# Patient Record
Sex: Female | Born: 1955 | Race: White | Hispanic: No
Health system: Western US, Academic
[De-identification: ages and names within clinical notes are randomized; demographics above are authoritative.]

## PROBLEM LIST (undated history)

## (undated) ENCOUNTER — Emergency Department (HOSPITAL_BASED_OUTPATIENT_CLINIC_OR_DEPARTMENT_OTHER): Admission: EM | Payer: Self-pay | Source: Home / Self Care

## (undated) VITALS — BP 100/60 | HR 84 | Temp 97.6°F | Resp 18

## (undated) VITALS — BP 110/60 | HR 65 | Temp 98.7°F | Resp 18 | Wt 176.0 lb

## (undated) VITALS — BP 126/62 | HR 72 | Temp 96.4°F | Resp 18 | Wt 175.4 lb

## (undated) DIAGNOSIS — J342 Deviated nasal septum: Secondary | ICD-10-CM

## (undated) DIAGNOSIS — H90A22 Sensorineural hearing loss, unilateral, left ear, with restricted hearing on the contralateral side: Secondary | ICD-10-CM

## (undated) DIAGNOSIS — E785 Hyperlipidemia, unspecified: Secondary | ICD-10-CM

## (undated) DIAGNOSIS — H90A31 Mixed conductive and sensorineural hearing loss, unilateral, right ear with restricted hearing on the contralateral side: Secondary | ICD-10-CM

## (undated) DIAGNOSIS — H2513 Age-related nuclear cataract, bilateral: Secondary | ICD-10-CM

## (undated) DIAGNOSIS — Z5111 Encounter for antineoplastic chemotherapy: Secondary | ICD-10-CM

## (undated) DIAGNOSIS — T17908A Unspecified foreign body in respiratory tract, part unspecified causing other injury, initial encounter: Secondary | ICD-10-CM

## (undated) DIAGNOSIS — J439 Emphysema, unspecified: Secondary | ICD-10-CM

## (undated) DIAGNOSIS — H6993 Unspecified Eustachian tube disorder, bilateral: Secondary | ICD-10-CM

## (undated) DIAGNOSIS — C719 Malignant neoplasm of brain, unspecified: Secondary | ICD-10-CM

## (undated) DIAGNOSIS — H3581 Retinal edema: Secondary | ICD-10-CM

## (undated) DIAGNOSIS — H7291 Unspecified perforation of tympanic membrane, right ear: Secondary | ICD-10-CM

## (undated) DIAGNOSIS — Z515 Encounter for palliative care: Secondary | ICD-10-CM

## (undated) DIAGNOSIS — C8599 Non-Hodgkin lymphoma, unspecified, extranodal and solid organ sites: Secondary | ICD-10-CM

## (undated) DIAGNOSIS — G44009 Cluster headache syndrome, unspecified, not intractable: Secondary | ICD-10-CM

## (undated) DIAGNOSIS — C8590 Non-Hodgkin lymphoma, unspecified, unspecified site: Secondary | ICD-10-CM

## (undated) DIAGNOSIS — R1311 Dysphagia, oral phase: Secondary | ICD-10-CM

## (undated) DIAGNOSIS — C8589 Other specified types of non-Hodgkin lymphoma, extranodal and solid organ sites: Secondary | ICD-10-CM

## (undated) DIAGNOSIS — J343 Hypertrophy of nasal turbinates: Secondary | ICD-10-CM

## (undated) DIAGNOSIS — C8339 Primary central nervous system lymphoma: Secondary | ICD-10-CM

## (undated) DIAGNOSIS — H903 Sensorineural hearing loss, bilateral: Secondary | ICD-10-CM

## (undated) DIAGNOSIS — I2510 Atherosclerotic heart disease of native coronary artery without angina pectoris: Secondary | ICD-10-CM

## (undated) DIAGNOSIS — F0390 Unspecified dementia without behavioral disturbance: Secondary | ICD-10-CM

## (undated) DIAGNOSIS — H6593 Unspecified nonsuppurative otitis media, bilateral: Secondary | ICD-10-CM

## (undated) DIAGNOSIS — M797 Fibromyalgia: Secondary | ICD-10-CM

## (undated) DIAGNOSIS — F4321 Adjustment disorder with depressed mood: Secondary | ICD-10-CM

## (undated) HISTORY — DX: Primary central nervous system lymphoma: C83.390

## (undated) HISTORY — DX: Sensorineural hearing loss, bilateral: H90.3

## (undated) HISTORY — DX: Adjustment disorder with depressed mood: F43.21

## (undated) HISTORY — DX: Cluster headache syndrome, unspecified, not intractable: G44.009

## (undated) HISTORY — DX: Fibromyalgia: M79.7

## (undated) HISTORY — DX: Sensorineural hearing loss, unilateral, left ear, with restricted hearing on the contralateral side: H90.A22

## (undated) HISTORY — DX: Encounter for palliative care: Z51.5

## (undated) HISTORY — PX: CATARACT EXTRACTION: SUR2

## (undated) HISTORY — DX: Hypertrophy of nasal turbinates: J34.3

## (undated) HISTORY — DX: Dysphagia, oral phase: R13.11

## (undated) HISTORY — DX: Unspecified perforation of tympanic membrane, right ear: H72.91

## (undated) HISTORY — DX: Retinal edema: H35.81

## (undated) HISTORY — DX: Other specified types of non-hodgkin lymphoma, extranodal and solid organ sites: C85.89

## (undated) HISTORY — DX: Non-Hodgkin lymphoma, unspecified, extranodal and solid organ sites: C85.99

## (undated) HISTORY — DX: Deviated nasal septum: J34.2

## (undated) HISTORY — DX: Unspecified dementia, unspecified severity, without behavioral disturbance, psychotic disturbance, mood disturbance, and anxiety: F03.90

## (undated) HISTORY — PX: BRAIN BIOPSY: SHX905

## (undated) HISTORY — DX: Mixed conductive and sensorineural hearing loss, unilateral, right ear with restricted hearing on the contralateral side: H90.A31

## (undated) HISTORY — PX: CHOLECYSTECTOMY: SHX55

## (undated) HISTORY — DX: Atherosclerotic heart disease of native coronary artery without angina pectoris: I25.10

## (undated) HISTORY — DX: Unspecified foreign body in respiratory tract, part unspecified causing other injury, initial encounter: T17.908A

## (undated) HISTORY — DX: Hyperlipidemia, unspecified: E78.5

## (undated) HISTORY — DX: Malignant neoplasm of brain, unspecified: C71.9

## (undated) HISTORY — DX: Encounter for antineoplastic chemotherapy: Z51.11

## (undated) HISTORY — DX: Unspecified eustachian tube disorder, bilateral: H69.93

## (undated) HISTORY — PX: TUBAL LIGATION: SHX77

## (undated) HISTORY — PX: TONSILLECTOMY: SUR1361

## (undated) HISTORY — DX: Age-related nuclear cataract, bilateral: H25.13

## (undated) HISTORY — DX: Emphysema, unspecified: J43.9

## (undated) HISTORY — DX: Unspecified nonsuppurative otitis media, bilateral: H65.93

## (undated) DEATH — deceased

---

## 1998-01-18 ENCOUNTER — Ambulatory Visit (HOSPITAL_BASED_OUTPATIENT_CLINIC_OR_DEPARTMENT_OTHER): Admission: RE | Admit: 1998-01-18 | Discharge: 1998-01-18 | Payer: Self-pay | Admitting: Orthopedic Surgery

## 1998-05-04 ENCOUNTER — Encounter: Admission: RE | Admit: 1998-05-04 | Discharge: 1998-06-03 | Payer: Self-pay | Admitting: Neurosurgery

## 2010-01-23 ENCOUNTER — Other Ambulatory Visit: Payer: Self-pay

## 2010-01-30 ENCOUNTER — Other Ambulatory Visit: Payer: Self-pay

## 2010-03-17 ENCOUNTER — Encounter (HOSPITAL_BASED_OUTPATIENT_CLINIC_OR_DEPARTMENT_OTHER): Payer: Self-pay | Admitting: Medical Oncology

## 2010-03-17 ENCOUNTER — Encounter (HOSPITAL_BASED_OUTPATIENT_CLINIC_OR_DEPARTMENT_OTHER): Payer: Medicare Other | Admitting: Hematology & Oncology

## 2010-04-17 ENCOUNTER — Other Ambulatory Visit: Payer: Self-pay

## 2010-06-03 ENCOUNTER — Other Ambulatory Visit: Payer: Self-pay

## 2010-06-04 ENCOUNTER — Other Ambulatory Visit: Payer: Self-pay

## 2010-06-05 ENCOUNTER — Other Ambulatory Visit: Payer: Self-pay

## 2010-06-06 ENCOUNTER — Other Ambulatory Visit: Payer: Self-pay

## 2010-06-07 ENCOUNTER — Other Ambulatory Visit: Payer: Self-pay

## 2010-06-08 ENCOUNTER — Other Ambulatory Visit: Payer: Self-pay

## 2010-06-09 ENCOUNTER — Other Ambulatory Visit: Payer: Self-pay

## 2010-06-10 ENCOUNTER — Other Ambulatory Visit: Payer: Self-pay

## 2010-06-11 ENCOUNTER — Other Ambulatory Visit: Payer: Self-pay

## 2010-06-13 ENCOUNTER — Other Ambulatory Visit: Payer: Self-pay

## 2010-06-15 ENCOUNTER — Other Ambulatory Visit: Payer: Self-pay

## 2010-06-16 ENCOUNTER — Other Ambulatory Visit: Payer: Self-pay

## 2010-06-17 ENCOUNTER — Other Ambulatory Visit: Payer: Self-pay

## 2010-06-18 ENCOUNTER — Other Ambulatory Visit: Payer: Self-pay

## 2010-06-19 ENCOUNTER — Other Ambulatory Visit: Payer: Self-pay

## 2010-06-20 ENCOUNTER — Other Ambulatory Visit: Payer: Self-pay

## 2010-06-21 ENCOUNTER — Other Ambulatory Visit: Payer: Self-pay

## 2010-06-22 ENCOUNTER — Other Ambulatory Visit: Payer: Self-pay

## 2010-06-23 ENCOUNTER — Other Ambulatory Visit: Payer: Self-pay

## 2010-06-24 ENCOUNTER — Other Ambulatory Visit: Payer: Self-pay

## 2010-06-25 ENCOUNTER — Other Ambulatory Visit: Payer: Self-pay

## 2012-11-05 ENCOUNTER — Other Ambulatory Visit: Payer: Self-pay

## 2013-12-08 ENCOUNTER — Emergency Department (HOSPITAL_BASED_OUTPATIENT_CLINIC_OR_DEPARTMENT_OTHER)
Admission: EM | Admit: 2013-12-08 | Discharge: 2013-12-08 | Disposition: A | Discharge status: Home / Self Care | Payer: Medicare Other | Attending: Emergency Medicine | Admitting: Emergency Medicine

## 2013-12-08 ENCOUNTER — Encounter (HOSPITAL_BASED_OUTPATIENT_CLINIC_OR_DEPARTMENT_OTHER): Payer: Self-pay | Admitting: Emergency Medicine

## 2013-12-08 ENCOUNTER — Emergency Department (HOSPITAL_BASED_OUTPATIENT_CLINIC_OR_DEPARTMENT_OTHER): Payer: Medicare Other

## 2013-12-08 DIAGNOSIS — S46909A Unspecified injury of unspecified muscle, fascia and tendon at shoulder and upper arm level, unspecified arm, initial encounter: Secondary | ICD-10-CM | POA: Insufficient documentation

## 2013-12-08 DIAGNOSIS — X500XXA Overexertion from strenuous movement or load, initial encounter: Secondary | ICD-10-CM | POA: Insufficient documentation

## 2013-12-08 DIAGNOSIS — S4980XA Other specified injuries of shoulder and upper arm, unspecified arm, initial encounter: Secondary | ICD-10-CM | POA: Insufficient documentation

## 2013-12-08 DIAGNOSIS — Z85841 Personal history of malignant neoplasm of brain: Secondary | ICD-10-CM | POA: Insufficient documentation

## 2013-12-08 DIAGNOSIS — R296 Repeated falls: Secondary | ICD-10-CM | POA: Insufficient documentation

## 2013-12-08 DIAGNOSIS — Z79899 Other long term (current) drug therapy: Secondary | ICD-10-CM | POA: Insufficient documentation

## 2013-12-08 DIAGNOSIS — IMO0002 Reserved for concepts with insufficient information to code with codable children: Secondary | ICD-10-CM | POA: Insufficient documentation

## 2013-12-08 DIAGNOSIS — F17200 Nicotine dependence, unspecified, uncomplicated: Secondary | ICD-10-CM | POA: Insufficient documentation

## 2013-12-08 DIAGNOSIS — S93401A Sprain of unspecified ligament of right ankle, initial encounter: Secondary | ICD-10-CM

## 2013-12-08 DIAGNOSIS — Z9221 Personal history of antineoplastic chemotherapy: Secondary | ICD-10-CM | POA: Insufficient documentation

## 2013-12-08 DIAGNOSIS — Y9389 Activity, other specified: Secondary | ICD-10-CM | POA: Insufficient documentation

## 2013-12-08 DIAGNOSIS — Y929 Unspecified place or not applicable: Secondary | ICD-10-CM | POA: Insufficient documentation

## 2013-12-08 DIAGNOSIS — S93409A Sprain of unspecified ligament of unspecified ankle, initial encounter: Secondary | ICD-10-CM | POA: Insufficient documentation

## 2013-12-08 HISTORY — DX: Non-Hodgkin lymphoma, unspecified, unspecified site: C85.90

## 2013-12-08 HISTORY — DX: Malignant neoplasm of brain, unspecified: C71.9

## 2013-12-08 MED ORDER — HYDROCODONE-ACETAMINOPHEN 5-325 MG PO TABS: 1.0000 | ORAL_TABLET | ORAL | Status: DC | PRN

## 2013-12-08 NOTE — ED Notes (Signed)
Pt refused crutches.

## 2013-12-08 NOTE — ED Provider Notes (Signed)
CSN: [id number]     Arrival date & time 12/08/13  1359 History  This chart was scribed for Orlie Dakin, MD by Vernell Barrier, ED scribe. This patient was seen in room [facility] and the patient's care was started at 3:08 PM.   Chief Complaint  Patient presents with  . Ankle Pain    The history is provided by the patient. No language interpreter was used.   HPI Comments: Krystal Stevens is a 58 y.o. female who presents to the Emergency Department complaining of right ankle pain and right ankle swelling; onset 2 days ago. Reports difficulty ambulating and pain with applying pressure. States she stepped off a curb incorrectly and her ankle twisted and she fell to the ground. Also reports some right elbow and right shoulder pain. Does not report any other injuries. Taken Vicodin for pain with minimal relief; last dosage 3 hours ago. States she took 1 pill at that time. Hx of brain cancer; still receiving chemotherapy. Reports intermittent memory lapse and trouble with balance and coordination as a result. Does not walk with a cane or walker. Hx of tubal ligation, tonsillectomy, and cholecystectomy. Regularly takes anti-depressant medication. Currently living in a nursing home.   Past Medical History  Diagnosis Date  . Brain cancer   . Non-Hodgkin's lymphoma    Past Surgical History  Procedure Laterality Date  . Cholecystectomy    . Tubal ligation    . Tonsillectomy    . Brain biopsy     No family history on file. History  Substance Use Topics  . Smoking status: Current Every Day Smoker -- 1.00 packs/day    Types: Cigarettes  . Smokeless tobacco: Not on file  . Alcohol Use: No   OB History   Grav Para Term Preterm Abortions TAB SAB Ect Mult Living                 Review of Systems  Constitutional: Negative.   HENT: Negative.   Respiratory: Negative.   Cardiovascular: Negative.   Gastrointestinal: Negative.   Musculoskeletal: Positive for arthralgias and joint swelling.   Skin: Negative.   Neurological: Negative.   Psychiatric/Behavioral: Negative.    Allergies  Review of patient's allergies indicates no known allergies.  Home Medications   Prior to Admission medications   Medication Sig Start Date End Date Taking? Authorizing Provider  citalopram (CELEXA) 10 MG tablet Take 10 mg by mouth daily.   Yes Historical Provider, MD  famotidine (PEPCID) 20 MG tablet Take 20 mg by mouth 2 (two) times daily.   Yes Historical Provider, MD  HYDROcodone-acetaminophen (NORCO) 7.5-325 MG per tablet Take 1 tablet by mouth every 6 (six) hours as needed for moderate pain.   Yes Historical Provider, MD  LORazepam (ATIVAN) 1 MG tablet Take 1 mg by mouth every 8 (eight) hours.   Yes Historical Provider, MD   Triage vitals: BP 109/69  Pulse 86  Temp(Src) 98.2 F (36.8 C) (Oral)  Resp 17  Ht 5\' 4"  (1.626 m)  Wt 170 lb (77.111 kg)  BMI 29.17 kg/m2  SpO2 98%  Physical Exam  Nursing note and vitals reviewed. Constitutional: She appears well-developed and well-nourished.  HENT:  Head: Normocephalic and atraumatic.  Eyes: Conjunctivae are normal. Pupils are equal, round, and reactive to light.  Neck: Neck supple. No tracheal deviation present. No thyromegaly present.  Cardiovascular: Normal rate and regular rhythm.   No murmur heard. Pulmonary/Chest: Effort normal and breath sounds normal.  Abdominal: Soft. Bowel sounds are normal.  She exhibits no distension. There is no tenderness.  Musculoskeletal: Normal range of motion. She exhibits no edema and no tenderness.  Right upper extremity: Small abrasion at the right elbow. No swelling. No point tenderness. Neurovascularly intact.   Right lower extremity: Tenderness at lateral malleolus. DP pulse 2+. Non tender over proximal fibula.   All other extremities are non atraumatic and neurovascularly in tact  Neurological: She is alert. Coordination normal.  Skin: Skin is warm and dry. No rash noted.  Psychiatric: She has  a normal mood and affect.   right upper extremity has full range of motion. The soft tissues  ED Course  Procedures (including critical care time) DIAGNOSTIC STUDIES: Oxygen Saturation is 98% on room air, normal by my interpretation.    COORDINATION OF CARE: At 3:15 PM: Discussed treatment plan with patient which includes pain medication and an ankle splint. Patient agrees.   Labs Review Labs Reviewed - No data to display  Imaging Review Dg Ankle Complete Right  12/08/2013   CLINICAL DATA:  Right ankle pain and swelling after injury.  EXAM: RIGHT ANKLE - COMPLETE 3+ VIEW  COMPARISON:  None.  FINDINGS: There is no evidence of fracture, dislocation, or joint effusion. There is no evidence of arthropathy or other focal bone abnormality. Soft tissue swelling over lateral malleolus is noted consistent with ligamentous injury.  IMPRESSION: No fracture or dislocation is noted. Soft tissue swelling over lateral malleolus consistent with ligamentous injury.   Electronically Signed   By: Sabino Dick M.D.   On: 12/08/2013 14:45     EKG Interpretation None     X-ray viewed by me. No results found for this or any previous visit. Dg Ankle Complete Right  12/08/2013   CLINICAL DATA:  Right ankle pain and swelling after injury.  EXAM: RIGHT ANKLE - COMPLETE 3+ VIEW  COMPARISON:  None.  FINDINGS: There is no evidence of fracture, dislocation, or joint effusion. There is no evidence of arthropathy or other focal bone abnormality. Soft tissue swelling over lateral malleolus is noted consistent with ligamentous injury.  IMPRESSION: No fracture or dislocation is noted. Soft tissue swelling over lateral malleolus consistent with ligamentous injury.   Electronically Signed   By: Sabino Dick M.D.   On: 12/08/2013 14:45   Patient is comfortable in air splint. She is unable to walk with crutches. Nursing home called. They will provide a wheelchair. MDM  X-rays of right upper extremity not indicated discuss with  patient and family who agree. Final diagnoses:  None   plan referral Dr.Hudnall, orthopedics. Prescription Norco Diagnosis #1 fall #2 right ankle sprain #3 contusions to right arm  I personally performed the services described in this documentation, which was scribed in my presence. The recorded information has been reviewed and is accurate.     Orlie Dakin, MD 12/08/13 [phone]

## 2013-12-08 NOTE — ED Notes (Signed)
Pt. Stepped off a curb without knowing there was a curb there causing injury to the R ankle.  Noted edema and pain 10/10.  Pt. Has positive pedal pulse.

## 2013-12-08 NOTE — Discharge Instructions (Signed)
Ankle Sprain °An ankle sprain is an injury to the strong, fibrous tissues (ligaments) that hold your ankle bones together.  °HOME CARE  °· Put ice on your ankle for 1-2 days or as told by your doctor. °¨ Put ice in a plastic bag. °¨ Place a towel between your skin and the bag. °¨ Leave the ice on for 15-20 minutes at a time, every 2 hours while you are awake. °· Only take medicine as told by your doctor. °· Raise (elevate) your injured ankle above the level of your heart as much as possible for 2-3 days. °· Use crutches if your doctor tells you to. Slowly put your own weight on the affected ankle. Use the crutches until you can walk without pain. °· If you have a plaster splint: °¨ Do not rest it on anything harder than a pillow for 24 hours. °¨ Do not put weight on it. °¨ Do not get it wet. °¨ Take it off to shower or bathe. °· If given, use an elastic wrap or support stocking for support. Take the wrap off if your toes lose feeling (numb), tingle, or turn cold or blue. °· If you have an air splint: °¨ Add or let out air to make it comfortable. °¨ Take it off at night and to shower and bathe. °¨ Wiggle your toes and move your ankle up and down often while you are wearing it. °GET HELP RIGHT AWAY IF:  °· Your toes lose feeling (numb) or turn blue. °· You have severe pain that is increasing. °· You have rapidly increasing bruising or puffiness (swelling). °· Your toes feel very cold. °· You lose feeling in your foot. °· Your medicine does not help your pain. °MAKE SURE YOU:  °· Understand these instructions. °· Will watch your condition. °· Will get help right away if you are not doing well or get worse. °Document Released: 11/08/2007 Document Revised: 02/14/2012 Document Reviewed: 12/04/2011 °[employer]® Patient Information ©2015 [employer]. This information is not intended to replace advice given to you by your health care provider. Make sure you discuss any questions you have with your health care provider. ° °

## 2013-12-11 ENCOUNTER — Ambulatory Visit (INDEPENDENT_AMBULATORY_CARE_PROVIDER_SITE_OTHER): Payer: Medicare Other | Admitting: Family Medicine

## 2013-12-11 ENCOUNTER — Encounter: Payer: Self-pay | Admitting: Family Medicine

## 2013-12-11 VITALS — BP 127/86 | HR 92 | Ht 64.0 in | Wt 180.0 lb

## 2013-12-11 DIAGNOSIS — S99929A Unspecified injury of unspecified foot, initial encounter: Secondary | ICD-10-CM

## 2013-12-11 DIAGNOSIS — S99919A Unspecified injury of unspecified ankle, initial encounter: Secondary | ICD-10-CM

## 2013-12-11 DIAGNOSIS — S8990XA Unspecified injury of unspecified lower leg, initial encounter: Secondary | ICD-10-CM

## 2013-12-11 DIAGNOSIS — S99911A Unspecified injury of right ankle, initial encounter: Secondary | ICD-10-CM

## 2013-12-11 NOTE — Patient Instructions (Signed)
You have an ankle sprain. Ice the area for 15 minutes at a time, 3-4 times a day Aleve 2 tabs twice a day with food OR ibuprofen 3 tabs three times a day with food for pain and inflammation. Elevate above the level of your heart when possible Bear weight when tolerated Use aircast or laceup ankle brace to help with stability while you recover from this injury. Come out of the boot/brace twice a day to do Up/down and alphabet exercises 2-3 sets of each. Start theraband strengthening exercises when directed - once a day 3 sets of 10 - we should add this at next visit in 2 weeks. Consider physical therapy for strengthening and balance exercises. If not improving as expected, we may repeat x-rays or consider further testing like an MRI. Follow up with me in 2 weeks.

## 2013-12-16 ENCOUNTER — Encounter: Payer: Self-pay | Admitting: Family Medicine

## 2013-12-16 DIAGNOSIS — S99911A Unspecified injury of right ankle, initial encounter: Secondary | ICD-10-CM | POA: Insufficient documentation

## 2013-12-16 NOTE — Progress Notes (Signed)
Patient ID: Mykala Mccready, female   DOB: 1956/05/02, 58 y.o.   MRN: [id number]  PCP: No primary provider on file.  Subjective:   HPI: Patient is a 58 y.o. female here for right ankle injury.  Patient reports on 7/4 she stepped off a curb and rolled ankle (she believes) - fell to ground. + swelling. Pain is lateral. Has been icing, taking vicodin, using aircast. No prior issues with this ankle. Able to bear weight.  Past Medical History  Diagnosis Date  . Brain cancer   . Non-Hodgkin's lymphoma     Current Outpatient Prescriptions on File Prior to Visit  Medication Sig Dispense Refill  . citalopram (CELEXA) 10 MG tablet Take 10 mg by mouth daily.      . famotidine (PEPCID) 20 MG tablet Take 20 mg by mouth 2 (two) times daily.      Marland Kitchen HYDROcodone-acetaminophen (NORCO) 5-325 MG per tablet Take 1-2 tablets by mouth every 4 (four) hours as needed for severe pain.  10 tablet  0  . LORazepam (ATIVAN) 1 MG tablet Take 1 mg by mouth every 8 (eight) hours.       No current facility-administered medications on file prior to visit.    Past Surgical History  Procedure Laterality Date  . Cholecystectomy    . Tubal ligation    . Tonsillectomy    . Brain biopsy      No Known Allergies  History   Social History  . Marital Status: Married    Spouse Name: N/A    Number of Children: N/A  . Years of Education: N/A   Occupational History  . Not on file.   Social History Main Topics  . Smoking status: Current Every Day Smoker -- 1.00 packs/day    Types: Cigarettes  . Smokeless tobacco: Not on file  . Alcohol Use: No  . Drug Use: Not on file  . Sexual Activity: Not on file   Other Topics Concern  . Not on file   Social History Narrative  . No narrative on file    No family history on file.  BP 127/86  Pulse 92  Ht 5\' 4"  (1.626 m)  Wt 180 lb (81.647 kg)  BMI 30.88 kg/m2  Review of Systems: See HPI above.    Objective:  Physical Exam:  Gen: NAD  Right  ankle: Mild swelling laterally.  No other deformity. Mild limitation ROM. TTP over ATFL.  No other tenderness. 1+ ant drawer and talar tilt.   Negative syndesmotic compression. Thompsons test negative. NV intact distally.    Assessment & Plan:  1. Right ankle injury - 2/2 sprain.  Improved quite a bit to this point.  Icing, nsaids, HEP reviewed.  Elevation, aircast or ASO for support.  F/u in 2 weeks - add strengthening at that time.

## 2013-12-16 NOTE — Assessment & Plan Note (Signed)
2/2 sprain.  Improved quite a bit to this point.  Icing, nsaids, HEP reviewed.  Elevation, aircast or ASO for support.  F/u in 2 weeks - add strengthening at that time.

## 2013-12-25 ENCOUNTER — Encounter: Payer: Self-pay | Admitting: Family Medicine

## 2013-12-25 ENCOUNTER — Ambulatory Visit (INDEPENDENT_AMBULATORY_CARE_PROVIDER_SITE_OTHER): Payer: Medicare Other | Admitting: Family Medicine

## 2013-12-25 VITALS — BP 118/80 | HR 90 | Ht 64.0 in | Wt 180.0 lb

## 2013-12-25 DIAGNOSIS — S8990XA Unspecified injury of unspecified lower leg, initial encounter: Secondary | ICD-10-CM

## 2013-12-25 DIAGNOSIS — S99929A Unspecified injury of unspecified foot, initial encounter: Secondary | ICD-10-CM

## 2013-12-25 DIAGNOSIS — S99911D Unspecified injury of right ankle, subsequent encounter: Secondary | ICD-10-CM

## 2013-12-25 DIAGNOSIS — S99919A Unspecified injury of unspecified ankle, initial encounter: Secondary | ICD-10-CM

## 2013-12-25 DIAGNOSIS — Z5189 Encounter for other specified aftercare: Secondary | ICD-10-CM

## 2013-12-25 NOTE — Patient Instructions (Signed)
You have an ankle sprain. Icing as needed at this point. Elevate above the level of your heart when possible Use laceup ankle brace for 4 more weeks when up and walking around.  Come out of the boot/brace twice a day to do Up/down and alphabet exercises 2-3 sets of each. Start theraband strengthening exercises - once a day 3 sets of 10. Consider physical therapy for strengthening and balance exercises if you're struggling. If not improving as expected, we may repeat x-rays or consider further testing like an MRI. Follow up with me in 4 weeks.

## 2013-12-29 ENCOUNTER — Encounter: Payer: Self-pay | Admitting: Family Medicine

## 2013-12-29 NOTE — Progress Notes (Signed)
Patient ID: Krystal Stevens, female   DOB: Dec 22, 1955, 58 y.o.   MRN: [id number]  PCP: No primary provider on file.  Subjective:   HPI: Patient is a 58 y.o. female here for right ankle injury.  7/9: Patient reports on 7/4 she stepped off a curb and rolled ankle (she believes) - fell to ground. + swelling. Pain is lateral. Has been icing, taking vicodin, using aircast. No prior issues with this ankle. Able to bear weight.  7/23: Patient feels better. She is using ASO still. Icing, doing HEP, takes vicodin if needed for pain. Elevating.  Past Medical History  Diagnosis Date  . Brain cancer   . Non-Hodgkin's lymphoma     Current Outpatient Prescriptions on File Prior to Visit  Medication Sig Dispense Refill  . citalopram (CELEXA) 10 MG tablet Take 10 mg by mouth daily.      . famotidine (PEPCID) 20 MG tablet Take 20 mg by mouth 2 (two) times daily.      Marland Kitchen HYDROcodone-acetaminophen (NORCO) 5-325 MG per tablet Take 1-2 tablets by mouth every 4 (four) hours as needed for severe pain.  10 tablet  0  . LORazepam (ATIVAN) 1 MG tablet Take 1 mg by mouth every 8 (eight) hours.       No current facility-administered medications on file prior to visit.    Past Surgical History  Procedure Laterality Date  . Cholecystectomy    . Tubal ligation    . Tonsillectomy    . Brain biopsy      No Known Allergies  History   Social History  . Marital Status: Married    Spouse Name: N/A    Number of Children: N/A  . Years of Education: N/A   Occupational History  . Not on file.   Social History Main Topics  . Smoking status: Current Every Day Smoker -- 1.00 packs/day    Types: Cigarettes  . Smokeless tobacco: Not on file  . Alcohol Use: No  . Drug Use: Not on file  . Sexual Activity: Not on file   Other Topics Concern  . Not on file   Social History Narrative  . No narrative on file    No family history on file.  BP 118/80  Pulse 90  Ht 5\' 4"  (1.626 m)  Wt 180 lb  (81.647 kg)  BMI 30.88 kg/m2  Review of Systems: See HPI above.    Objective:  Physical Exam:  Gen: NAD  Right ankle: Mild swelling laterally.  No other deformity. FROM. TTP over ATFL minimally.  No other tenderness. 1+ ant drawer and talar tilt.   Negative syndesmotic compression. Thompsons test negative. NV intact distally.    Assessment & Plan:  1. Right ankle injury - 2/2 sprain.  Doing very well.  Add strengthening exercises.  ASO for 4 more weeks.  F/u in 4 weeks.  Consider PT.

## 2013-12-29 NOTE — Assessment & Plan Note (Signed)
2/2 sprain.  Doing very well.  Add strengthening exercises.  ASO for 4 more weeks.  F/u in 4 weeks.  Consider PT.

## 2014-01-22 ENCOUNTER — Ambulatory Visit (INDEPENDENT_AMBULATORY_CARE_PROVIDER_SITE_OTHER): Payer: Medicare Other | Admitting: Family Medicine

## 2014-01-22 ENCOUNTER — Encounter: Payer: Self-pay | Admitting: Family Medicine

## 2014-01-22 VITALS — BP 115/81 | Ht 64.0 in | Wt 185.0 lb

## 2014-01-22 DIAGNOSIS — S99911D Unspecified injury of right ankle, subsequent encounter: Secondary | ICD-10-CM

## 2014-01-22 DIAGNOSIS — S99929A Unspecified injury of unspecified foot, initial encounter: Secondary | ICD-10-CM

## 2014-01-22 DIAGNOSIS — Z5189 Encounter for other specified aftercare: Secondary | ICD-10-CM

## 2014-01-22 DIAGNOSIS — S8990XA Unspecified injury of unspecified lower leg, initial encounter: Secondary | ICD-10-CM

## 2014-01-22 DIAGNOSIS — S99919A Unspecified injury of unspecified ankle, initial encounter: Secondary | ICD-10-CM

## 2014-01-22 NOTE — Patient Instructions (Signed)
Call me if you want to do physical therapy. I would encourage you to use the laceup brace until your pain has resolved when you're up and walking around. And do the exercises most days of the week. Follow up with me as needed otherwise.

## 2014-01-26 ENCOUNTER — Encounter: Payer: Self-pay | Admitting: Family Medicine

## 2014-01-26 NOTE — Progress Notes (Signed)
Patient ID: Krystal Stevens, female   DOB: December 21, 1955, 58 y.o.   MRN: [id number]  PCP: No primary provider on file.  Subjective:   HPI: Patient is a 58 y.o. female here for right ankle injury.  7/9: Patient reports on 7/4 she stepped off a curb and rolled ankle (she believes) - fell to ground. + swelling. Pain is lateral. Has been icing, taking vicodin, using aircast. No prior issues with this ankle. Able to bear weight.  7/23: Patient feels better. She is using ASO still. Icing, doing HEP, takes vicodin if needed for pain. Elevating.  8/20: Patient reports she is some better from last visit. Cannot find her ASO now. No longer doing home exercise program. Still has some pain lateral ankle.  Past Medical History  Diagnosis Date  . Brain cancer   . Non-Hodgkin's lymphoma     Current Outpatient Prescriptions on File Prior to Visit  Medication Sig Dispense Refill  . citalopram (CELEXA) 10 MG tablet Take 10 mg by mouth daily.      . famotidine (PEPCID) 20 MG tablet Take 20 mg by mouth 2 (two) times daily.      Marland Kitchen HYDROcodone-acetaminophen (NORCO) 5-325 MG per tablet Take 1-2 tablets by mouth every 4 (four) hours as needed for severe pain.  10 tablet  0  . LORazepam (ATIVAN) 1 MG tablet Take 1 mg by mouth every 8 (eight) hours.       No current facility-administered medications on file prior to visit.    Past Surgical History  Procedure Laterality Date  . Cholecystectomy    . Tubal ligation    . Tonsillectomy    . Brain biopsy      No Known Allergies  History   Social History  . Marital Status: Married    Spouse Name: N/A    Number of Children: N/A  . Years of Education: N/A   Occupational History  . Not on file.   Social History Main Topics  . Smoking status: Current Every Day Smoker -- 1.00 packs/day    Types: Cigarettes  . Smokeless tobacco: Not on file  . Alcohol Use: No  . Drug Use: Not on file  . Sexual Activity: Not on file   Other Topics  Concern  . Not on file   Social History Narrative  . No narrative on file    No family history on file.  BP 115/81  Ht 5\' 4"  (1.626 m)  Wt 185 lb (83.915 kg)  BMI 31.74 kg/m2  Review of Systems: See HPI above.    Objective:  Physical Exam:  Gen: NAD  Right ankle: Mild swelling laterally.  No other deformity. FROM. TTP over ATFL minimally.  No other tenderness. 1+ ant drawer and talar tilt.   Negative syndesmotic compression. Thompsons test negative. NV intact distally.    Assessment & Plan:  1. Right ankle injury - 2/2 sprain.  Doing very well though with some pain and instability still on exam. Encouraged to do exercises more regularly and use the ASO until pain has resolved on ambulation.  Declined PT for now - call if she wants to go ahead with this.  Otherwise f/u prn.

## 2014-01-26 NOTE — Assessment & Plan Note (Signed)
2/2 sprain.  Doing very well though with some pain and instability still on exam. Encouraged to do exercises more regularly and use the ASO until pain has resolved on ambulation.  Declined PT for now - call if she wants to go ahead with this.  Otherwise f/u prn.

## 2015-06-14 IMAGING — CR DG ANKLE COMPLETE 3+V*R*
3 series · 3 of 3 positions shown · non-contrast
Comparison: None.

CLINICAL DATA: Right ankle pain and swelling after injury.

EXAM:
RIGHT ANKLE - COMPLETE 3+ VIEW

[t ankle joint ap right]
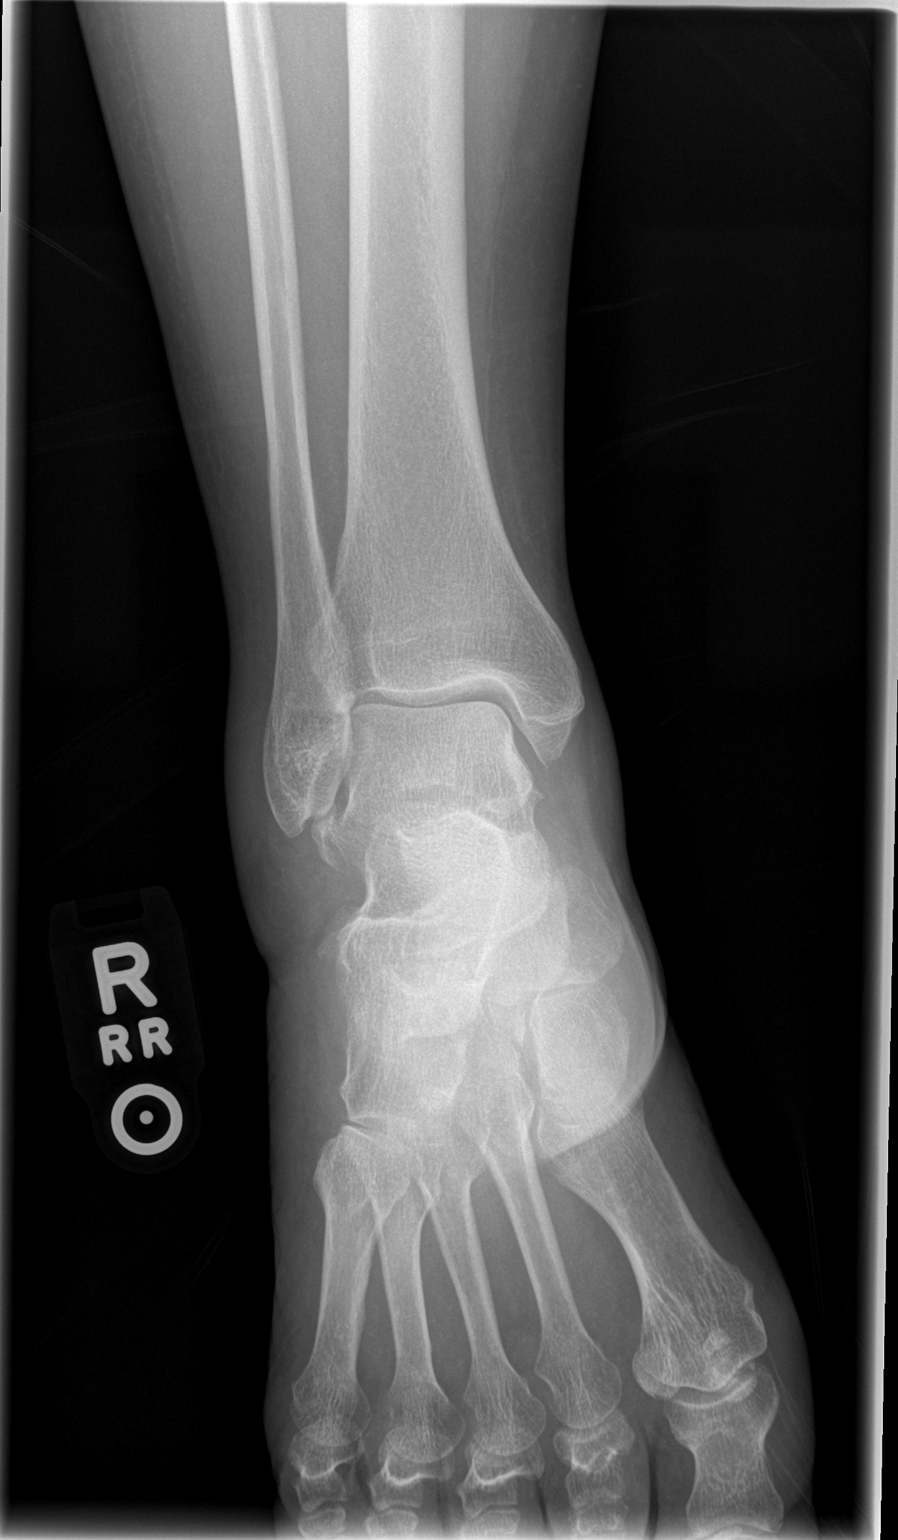

[t ankle joint oblique right]
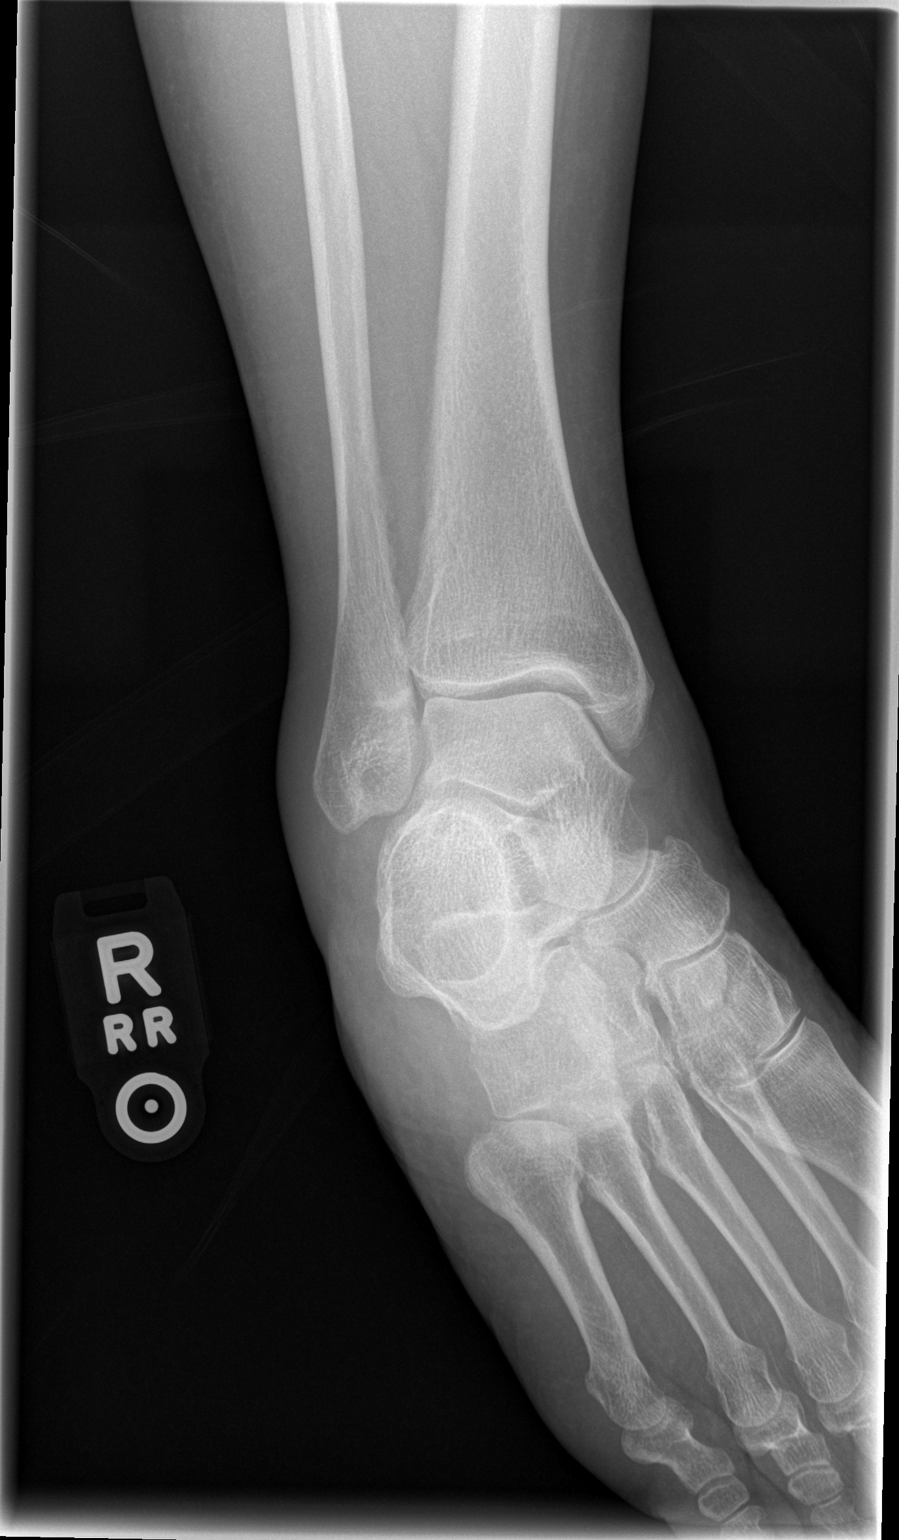

[t ankle joint lat right]
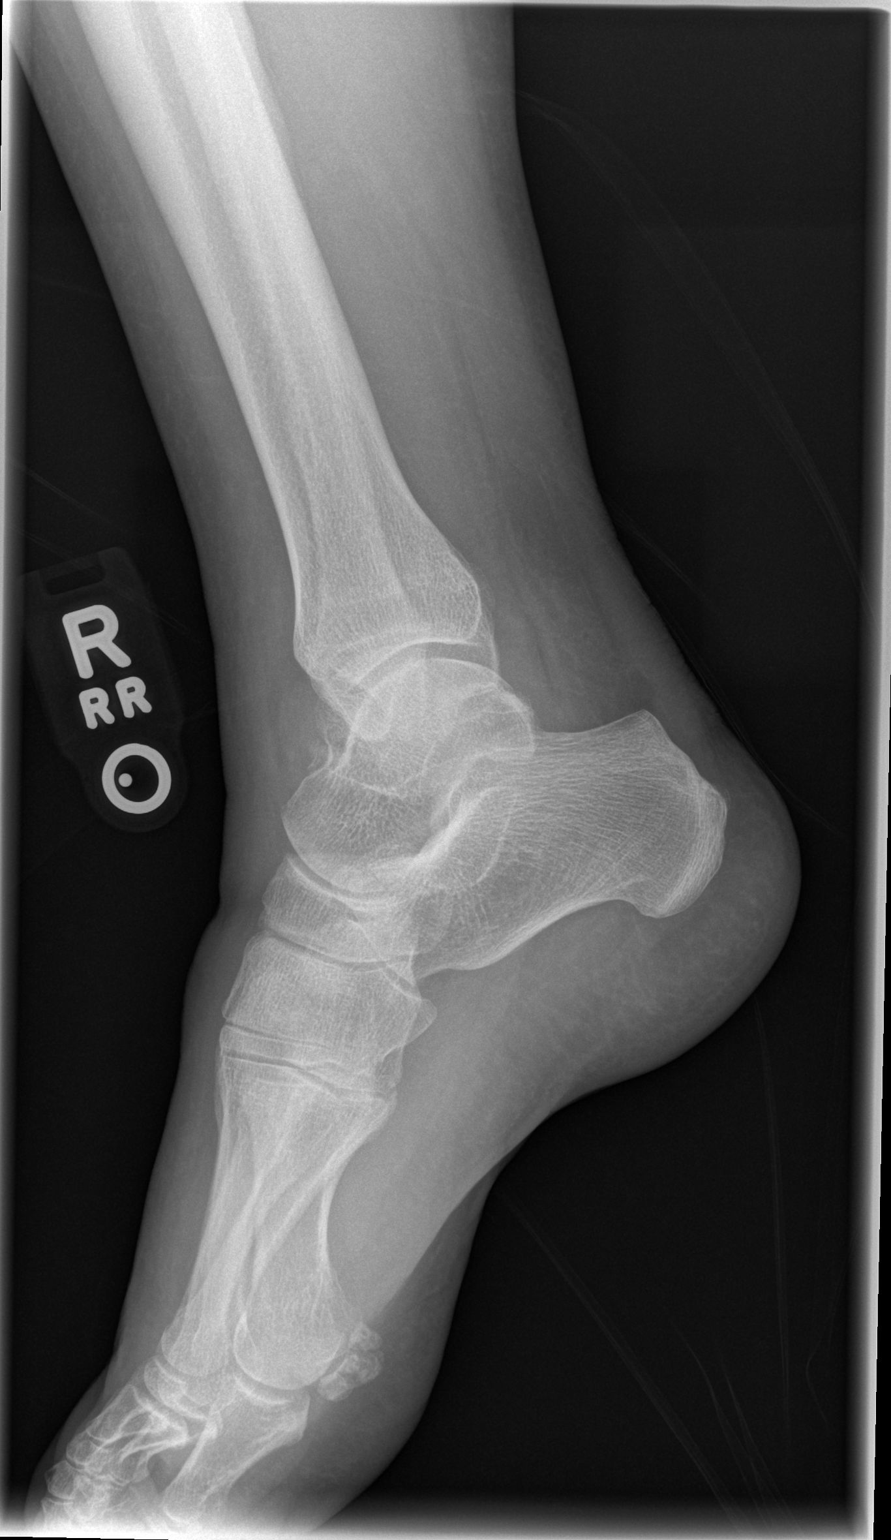

[3 of 3 positions shown; findings below may reference images not displayed]

FINDINGS: There is no evidence of fracture, dislocation, or joint effusion.
There is no evidence of arthropathy or other focal bone abnormality.
Soft tissue swelling over lateral malleolus is noted consistent with
ligamentous injury.
IMPRESSION: No fracture or dislocation is noted. Soft tissue swelling over
lateral malleolus consistent with ligamentous injury.

## 2016-08-17 ENCOUNTER — Telehealth (HOSPITAL_BASED_OUTPATIENT_CLINIC_OR_DEPARTMENT_OTHER): Payer: Self-pay | Admitting: Ophthalmology

## 2016-08-17 NOTE — Telephone Encounter (Signed)
We received a referral from Dr. Cena Benton to evaluate the patient for vitritis/retinitis at 3:00 pm.   Vision LP right eye; LP left eye. Dr. Cena Benton talked to Dr. Karren Cobble.  Dr. Karren Cobble wants the patient to come in to see Dr. Bryson Ha or go to the ED now.  I called the patient's home number and "the voice mail has not been set up" .  I called the patient's friends number and left a message for the patient to go to the ED.  I called Dr. Vaughan Basta office and got a 3rd number to call  and the message says, "voice mail has not been set up."  I called Dr. Vaughan Basta office and told them that I could not get a hold of the patient.  I will try again first thing in the morning.  The chart notes are on my desk.

## 2016-08-18 NOTE — Telephone Encounter (Signed)
I left another voice mail today with the patient's friend that we want to see the patient today.  Patient's phone does not have voice mail.

## 2016-08-25 ENCOUNTER — Telehealth (HOSPITAL_BASED_OUTPATIENT_CLINIC_OR_DEPARTMENT_OTHER): Payer: Self-pay

## 2016-08-25 ENCOUNTER — Encounter (HOSPITAL_BASED_OUTPATIENT_CLINIC_OR_DEPARTMENT_OTHER): Payer: Self-pay

## 2016-08-25 ENCOUNTER — Telehealth (HOSPITAL_BASED_OUTPATIENT_CLINIC_OR_DEPARTMENT_OTHER): Payer: Self-pay | Admitting: Ophthalmology

## 2016-08-25 NOTE — Telephone Encounter (Signed)
transferred phone call to triage Tim.

## 2016-08-25 NOTE — Telephone Encounter (Signed)
(  TEXTING IS AN OPTION FOR [facility] ONLY)  Is this a [facility]? No      RETURN CALL: Detailed message on voicemail only      SUBJECT:  General Message     REASON FOR REQUEST: patient's daughter Florentina Jenny is calling from [place] to possibly speak with clinic to designate her as POA; requesting to have access to patient's records once established.     MESSAGE: Please contact karla back as soon as possible. Thank you!

## 2016-08-25 NOTE — Telephone Encounter (Signed)
family returned  Call  Last instructions were to come to ER for evaluation  Asked to come to clinic they would not be able to come before 1700 do to distance and other appointments   scheduled  in [facility]  Unable to see both eyes sudden onset 2+ weeks ago  Light only per friend both eyes  On call md notified    Marisue Ivan, RN

## 2016-08-26 ENCOUNTER — Other Ambulatory Visit: Payer: Self-pay | Admitting: Ophthalmology

## 2016-08-26 ENCOUNTER — Encounter (HOSPITAL_BASED_OUTPATIENT_CLINIC_OR_DEPARTMENT_OTHER): Payer: Self-pay | Admitting: Ophthalmology

## 2016-08-26 ENCOUNTER — Inpatient Hospital Stay (HOSPITAL_COMMUNITY): Payer: Medicare Other | Admitting: Internal Medicine

## 2016-08-26 ENCOUNTER — Other Ambulatory Visit: Payer: Self-pay | Admitting: Family Medicine

## 2016-08-26 ENCOUNTER — Inpatient Hospital Stay (HOSPITAL_BASED_OUTPATIENT_CLINIC_OR_DEPARTMENT_OTHER)
Admission: AD | Admit: 2016-08-26 | Discharge: 2016-09-04 | DRG: 841 | Disposition: A | Discharge status: Skilled Nursing Facility | Payer: Medicare Other | Attending: Internal Medicine | Admitting: Internal Medicine

## 2016-08-26 DIAGNOSIS — Z8579 Personal history of other malignant neoplasms of lymphoid, hematopoietic and related tissues: Secondary | ICD-10-CM

## 2016-08-26 DIAGNOSIS — C8339 Diffuse large B-cell lymphoma, extranodal and solid organ sites: Principal | ICD-10-CM | POA: Diagnosis present

## 2016-08-26 DIAGNOSIS — F17210 Nicotine dependence, cigarettes, uncomplicated: Secondary | ICD-10-CM | POA: Diagnosis present

## 2016-08-26 DIAGNOSIS — Z9221 Personal history of antineoplastic chemotherapy: Secondary | ICD-10-CM

## 2016-08-26 DIAGNOSIS — G8929 Other chronic pain: Secondary | ICD-10-CM | POA: Diagnosis present

## 2016-08-26 DIAGNOSIS — J45909 Unspecified asthma, uncomplicated: Secondary | ICD-10-CM | POA: Diagnosis present

## 2016-08-26 DIAGNOSIS — H547 Unspecified visual loss: Secondary | ICD-10-CM

## 2016-08-26 DIAGNOSIS — Z720 Tobacco use: Secondary | ICD-10-CM

## 2016-08-26 DIAGNOSIS — F1510 Other stimulant abuse, uncomplicated: Secondary | ICD-10-CM | POA: Diagnosis present

## 2016-08-26 DIAGNOSIS — H44113 Panuveitis, bilateral: Secondary | ICD-10-CM

## 2016-08-26 DIAGNOSIS — F1290 Cannabis use, unspecified, uncomplicated: Secondary | ICD-10-CM | POA: Diagnosis present

## 2016-08-26 DIAGNOSIS — R918 Other nonspecific abnormal finding of lung field: Secondary | ICD-10-CM

## 2016-08-26 DIAGNOSIS — Z923 Personal history of irradiation: Secondary | ICD-10-CM

## 2016-08-26 DIAGNOSIS — Z801 Family history of malignant neoplasm of trachea, bronchus and lung: Secondary | ICD-10-CM

## 2016-08-26 DIAGNOSIS — N39498 Other specified urinary incontinence: Secondary | ICD-10-CM

## 2016-08-26 DIAGNOSIS — Z8572 Personal history of non-Hodgkin lymphomas: Secondary | ICD-10-CM

## 2016-08-26 DIAGNOSIS — G548 Other nerve root and plexus disorders: Secondary | ICD-10-CM

## 2016-08-26 DIAGNOSIS — M170 Bilateral primary osteoarthritis of knee: Secondary | ICD-10-CM | POA: Diagnosis present

## 2016-08-26 DIAGNOSIS — G971 Other reaction to spinal and lumbar puncture: Secondary | ICD-10-CM | POA: Diagnosis not present

## 2016-08-26 DIAGNOSIS — Z66 Do not resuscitate: Secondary | ICD-10-CM | POA: Diagnosis present

## 2016-08-26 DIAGNOSIS — M5136 Other intervertebral disc degeneration, lumbar region: Secondary | ICD-10-CM

## 2016-08-26 DIAGNOSIS — M545 Low back pain: Secondary | ICD-10-CM | POA: Diagnosis present

## 2016-08-26 DIAGNOSIS — N3941 Urge incontinence: Secondary | ICD-10-CM | POA: Diagnosis present

## 2016-08-26 DIAGNOSIS — Z807 Family history of other malignant neoplasms of lymphoid, hematopoietic and related tissues: Secondary | ICD-10-CM

## 2016-08-26 LAB — CBC, DIFF
% Basophils: 1 %
% Eosinophils: 3 %
% Immature Granulocytes: 1 %
% Lymphocytes: 39 %
% Monocytes: 7 %
% Neutrophils: 49 %
Absolute Eosinophil Count: 0.33 10*3/uL (ref 0.00–0.50)
Absolute Lymphocyte Count: 4.22 10*3/uL (ref 1.00–4.80)
Basophils: 0.06 10*3/uL (ref 0.00–0.20)
Hematocrit: 37 % (ref 36–45)
Hemoglobin: 12.6 g/dL (ref 11.5–15.5)
Immature Granulocytes: 0.05 10*3/uL (ref 0.00–0.05)
MCH: 32.4 pg (ref 27.3–33.6)
MCHC: 34.5 g/dL (ref 32.2–36.5)
MCV: 94 fL (ref 81–98)
Monocytes: 0.81 10*3/uL — ABNORMAL HIGH (ref 0.00–0.80)
Neutrophils: 5.43 10*3/uL (ref 1.80–7.00)
Platelet Count: 242 10*3/uL (ref 150–400)
RBC: 3.89 10*6/uL (ref 3.80–5.00)
RDW-CV: 12.9 % (ref 11.6–14.4)
WBC: 10.9 10*3/uL — ABNORMAL HIGH (ref 4.30–10.00)

## 2016-08-26 LAB — LAB UNDEFINED ORCA/EPIC ORDER

## 2016-08-26 LAB — BASIC METABOLIC PANEL
Anion Gap: 6 (ref 4–12)
Calcium: 8.7 mg/dL — ABNORMAL LOW (ref 8.9–10.2)
Carbon Dioxide, Total: 28 meq/L (ref 22–32)
Chloride: 107 meq/L (ref 98–108)
Creatinine: 0.99 mg/dL (ref 0.38–1.02)
GFR, Calc, African American: >60 mL/min/{1.73_m2} (ref >59)
GFR, Calc, European American: 57 mL/min/{1.73_m2} — ABNORMAL LOW (ref >59)
Glucose: 91 mg/dL (ref 62–125)
Potassium: 3.8 meq/L (ref 3.6–5.2)
Sodium: 141 meq/L (ref 135–145)
Urea Nitrogen: 30 mg/dL — ABNORMAL HIGH (ref 8–21)

## 2016-08-26 LAB — BLOOD C/S

## 2016-08-26 NOTE — Progress Notes (Signed)
Chief complaint:   Chief Complaint   Patient presents with   . 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 Patient is a very poor historian and gets frustrated when asked about details. She states that vision loss was sudden and that she saw an ophthalmologist Dr. Arbie Cookey at [facility], who referred her to a laser and eye center in [place] for cataract surgery. At that time, she was told to go to [facility] for possible retinal detachments. It is unclear when exactly this was but per pt it was about 3 months ago, and per patient's aunt who accompanies her today, it was at least several weeks ago. She was unable to make it to [facility] due to lack of transportation until today. Patient states she only sees bits of light in both of her eyes.    In terms of her CNS lymphoma, patient states she received 5 months of once a month high dose methotrexate infusions through a port about 2 years ago. Last dose was 08/2015. She was told she did not need radiation because her lymphoma was in remission per a MRI scan done 08/2015. She also had an appointment with neurosurgery which she no showed 03/2010. Per her, she was told the lymphoma was unresectable because of its location.    Uveitis Review of Systems:   Oral ulcers: no   Genital ulcers: no   Cold sores: no   Ringing in ears: at times for years   Weight loss: lost 15 lbs in last month  Weight gain: no  Numbness, tingling, weakness: no  Bowel and bladder incontinence: no   Diarrhea: no   Blood in stool: no   Sexually transmitted diseases: no   Arthritis: knees, back pain for years  Skin rashes: no   Cats at home: 20 cats at home   Dogs at home: 1 dog  Foreign travel: no   Undercooked meat/sushi: eats steak rare  Tick bites: flea bites, no known tick bites    ROS: All systems reviewed, negative except as noted above and  per HPI.    POcHx: Denies    OcMeds:   Denies    PMHx:    CNS lymphoma diagnosed 2010; states her next appointment with oncologist is 4/11   - currently in remission per patient   - Dr. Chancy Milroy at [facility]   - per Epic, patient no showed to appointments with neurosurgery 03/2010    Meds:    Denies    ALL:    Review of patient's allergies indicates:  No Known Allergies    FamHx:  No blindness, glaucoma, macular degeneration, retinal detachments.     SocHx:   Lives in [place], [place].   Works as: used to work as Network engineer, now unemployed  Tob:  53 pack year smoking history, current smoker  ETOH: heavy abuse years ago, currently not drinking  Recreational drug use: marijuana use weeks ago, methamphetamines months ago (snorted), denies IVDU      PHYSICAL EXAM:   Eyes: See Ophth Exam Section   Neuro/Psych: See Ophth Exam Section   Constitutional: Notable for the following: No acute distress   Musculoskeletal: head is normocepalic, no skeletal abnormalities of upper extremities  Skin: No facial rashes  [id number] ears and nose are normal in appearance  Respiratory: Normal respiratory effort  B-Scan (08/26/16, Sweeney/[facility] Carmack)  OD: diffuse loculated vitritis, no SRF, no RT/RD  OS: diffuse loculated vitritis, no SRF, no RT/RD    MRI Brain (08/26/15)  1.Decreased total volume of enhancing RIGHT basal ganglia tissue.  Encephalomalacia in the tumor bed and slight enlargement of 3rd ventricle.   Findings consistent with response to therapy.    2.Punctate focus of LEFT frontal lobe deep white matter enhancement seen on  prior exam no longer appreciated.No new enhancing foci identified.    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  - now with LP vision both eyes over the last 3 months  - concerning for CNS lymphoma given known history of CNS lymphoma, vitritis, with yellow-white subretinal infiltrates in absence of pain  - infection (viral, fungal, and much less likely bacterial) is a possibility although less likely given timing is  consistent with CNS lymphoma, pt has no indwelling catheters, and adamantly denies IVDU  - tap and inject today   - inject foscarnet, voriconazole   - f/u viral, bacterial, fungal PCR  - recommend starting fluconazole, Valtrex, Bactrim empirically  - draw ACE, CXR, Quant, syphilis, toxo, HIV  - recommend admission  - recommend MRI for CNS lymphoma  - recommend LP  - obtain blood cultures  - right eye with significant vitritis and if diagnosis not obtained from MRI or LP; diagnostic vitrectomy for lymphoma may be indicated    Seen with Dr. Christoper Fabian PGY-4 and Dr. Karren Cobble (uveitis fellow)    Eileen Pert, Eileen [name]  PGY-2 Ophthalmology    Oncologist  [place] [facility] [place] [address]    Best contacts  Butch Penny (friend who pt lives with): [phone]  Patient: [phone]  Carleene Mains (aunt): [phone]    Referring:  [facility]    PCP:    Pcp, Unknown     Intravitreal Tap and Injection Procedure note:    Right eye    Ocular diagnosis:   Encounter Diagnosis   Name Primary?   . Panuveitis of both eyes Yes     Final verification:  Correct patient NAME and ID YES   Correct EYE marked YES   Correct PROCEDURE YES   Correct EQUIPMENT and SETTINGS YES   Completed CONSENT YES  Date: 08/26/16     Prior exam notes were available and reviewed: YES    Procedure:  Treatment medication: Other: 0.1cc foscarnet, 0.1cc voriconazole   anesthesia tetracaine 0.5% one drop   wire lid speculum    povidone iodine (betadine) prep to ocular surface,lids, lid margins   Cotton tip soaked in 5% betadine used to displace the conjunctiva over the injection site   25 gauge needle, 3 mL syringe for Vitreous tap   30 gauge needle, 82mL syringe for Injection   inferotemporal quadrant, 3.5-4 mm from limbus   Stable pressure post injection (IOP=19)    ocular surface rinsed    Complications: none  Procedure Perfomed by: Minette Headland, Eileen [name]

## 2016-08-27 ENCOUNTER — Other Ambulatory Visit: Payer: Self-pay | Admitting: Family Medicine

## 2016-08-27 DIAGNOSIS — H538 Other visual disturbances: Secondary | ICD-10-CM

## 2016-08-27 DIAGNOSIS — H543 Unqualified visual loss, both eyes: Secondary | ICD-10-CM

## 2016-08-27 DIAGNOSIS — R61 Generalized hyperhidrosis: Secondary | ICD-10-CM

## 2016-08-27 DIAGNOSIS — R937 Abnormal findings on diagnostic imaging of other parts of musculoskeletal system: Secondary | ICD-10-CM

## 2016-08-27 DIAGNOSIS — Z8572 Personal history of non-Hodgkin lymphomas: Secondary | ICD-10-CM

## 2016-08-27 DIAGNOSIS — H44113 Panuveitis, bilateral: Secondary | ICD-10-CM

## 2016-08-27 DIAGNOSIS — R9089 Other abnormal findings on diagnostic imaging of central nervous system: Secondary | ICD-10-CM

## 2016-08-27 LAB — STANDARD DRUG SCREEN, URN
Acetaminophen Qualitative, URN: POSITIVE — AB
Alcohol (Ethyl), URN: NEGATIVE mg/dL
Amphet/Methamphetamine Qual,URN: POSITIVE — AB
Barbiturate (Qual), URN: NEGATIVE
Benzodiazepines (Qual), URN: NEGATIVE
Cannabinoids (Qual), URN: NEGATIVE
Cocaine (Qual), URN: NEGATIVE
Methadone (Qual), URN: NEGATIVE
Opiates (Qual), URN: POSITIVE — AB
Phencyclidine (Qual), URN: NEGATIVE
Tricyclic Antidepressants, URN: NEGATIVE

## 2016-08-27 NOTE — Progress Notes (Signed)
I have seen and examined the patient and was present during all parts of the procedure. I agree with resident's assessment and plan with the addition of the following:    Assessment:     This is an unfortunate case where Eileen [name] appears to have lost vision months ago. Unclear if it was sudden or gradual. She was initially referred urgently to me by a Retina specialist, Hsushi Cena Benton, several weeks ago. Due to some social challenges, she was unable to come in until today. She is accompanied by her aunt who does not know much about her current situation or level of functioning as they do not live together and are not close. Patient does endorse confusion over the past year.    This patient has a panuveitis of both eyes with significant disease of the deep/sub-retina and mild choroidal thickening on B-scan. In the setting of CNS lymphoma, and the time course of symptoms, the highest diagnosis on the differential is local recurrence of CNS lymphoma. Less likely are infectious etiologies, and if we are considering infection most likely culprit would be fungal given chronicity. Viral and bacterial etiologies would be highly unlikely given time course lack of systemic symptoms and no pain on presentation. A primary acquired toxoplasmosis is also highly unlikely given appearance.     Plan:  1) Given chronicity and low suspicion for infection, we plan for tap and inject of only the right eye with voriconazole and foscarnet. Sending samples for viral/bacterial/fungal PCR.  2) We will also send bacterial and fungal blood cultures, and toxoplasma IgG/IgM and syphilis serologies and quantiferon gold.  3) We will test for non-infectious causes of panuveitis such as sarcoid with ACE and CXR.  4) Given the profound vision loss and degree of disease, we recommend coverage for toxoplasmosis with bactrim, fungal infection with fluconazole and valtrex for viral if patient does not have contraindications to this regimen.  5) We plan  to peel away these medicines as results for workup come back.  6) Plan for LP/MRI to evaluate for recurrence of CNS lymphoma  7) If lumbar puncture is negative or patient refuses, plan for diagnostic vitrectomy in this patient with LP vision.  8) Refer to retina for initial eval in anticipation of diagnostic vitrectomy while patient is in house.    Minette Headland, MD  Uveitis Fellow

## 2016-08-28 ENCOUNTER — Other Ambulatory Visit: Payer: Self-pay | Admitting: Internal Medicine

## 2016-08-28 ENCOUNTER — Ambulatory Visit (HOSPITAL_BASED_OUTPATIENT_CLINIC_OR_DEPARTMENT_OTHER): Payer: Medicare Other | Admitting: Ophthalmology

## 2016-08-28 DIAGNOSIS — G039 Meningitis, unspecified: Secondary | ICD-10-CM

## 2016-08-28 DIAGNOSIS — G9389 Other specified disorders of brain: Secondary | ICD-10-CM

## 2016-08-28 DIAGNOSIS — H44113 Panuveitis, bilateral: Secondary | ICD-10-CM

## 2016-08-28 DIAGNOSIS — F29 Unspecified psychosis not due to a substance or known physiological condition: Secondary | ICD-10-CM

## 2016-08-28 LAB — EYE FLUID VIRAL PCR QUANT PANEL
CMV Quant Result: NOT DETECTED [IU]/mL
HSV Type 1 Quant PCR Result: NOT DETECTED {copies}/mL
HSV Type 2 Quant PCR Result: NOT DETECTED {copies}/mL
VZV Quant Result: NOT DETECTED {copies}/mL

## 2016-08-28 LAB — ANTI TOXOPLASMA, IGG, IGM
Anti Toxoplasma, IgG Interp: REACTIVE — AB
Anti Toxoplasma, IgG Result: 42.1 [IU]/mL — ABNORMAL HIGH (ref 0.0–7.4)
Anti Toxoplasma, IgM Interp: UNDETERMINED — AB

## 2016-08-28 LAB — PROTHROMBIN TIME
Prothrombin INR: 0.9 (ref 0.8–1.3)
Prothrombin Time Patient: 12.2 s (ref 10.7–15.6)

## 2016-08-28 LAB — SEROLOGIC SYPHILIS PANEL, SRM
Syphilis Igg Ab Result: NEGATIVE
Syphilis Screening Status: NEGATIVE

## 2016-08-28 NOTE — Progress Notes (Addendum)
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 Patient is a very poor historian and gets frustrated when asked about details. She states that vision loss was sudden and that she saw an Ophthalmologist Dr. Arbie Cookey at [facility], who referred her to a laser and eye center in [place] for cataract surgery. At that time, she was told to go to [facility] and urgently referred to Dr. Karren Cobble. 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 Right eye was tapped and injected with foscarnet and voriconazole with samples sent for viral, bacterial, and fungal PCR. Subsequently, patient was started on empiric fluconazole, Valtrex, and Bactrim in addition to testing for TB, syphilis, toxoplasmosis, and HIV.  She was then admitted on 08/26/16 for expedited workup including MRI and LP.      Interval Events (08/28/16):  Patient states that she is doing well. She denies any pain or changes in her vision as compared to yesterday.     Oncology Course (Per Heme/Onc Note dated 08/27/16): "With regards to her PCNSL history, she first presented to [facility] in [place], [place] in 01/2009 with two week history of headaches, short term memory loss, and difficulty ambulating. 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 She was started on HD MTX (8g/m2) and underwent 3 cycles on 02/06/2009, 02/16/2018, 03/02/2009 with MRI following her second cycle revealing near complete resolution of her lesions.  She generally tolerated treatment well with side effects of  nausea and headches relieved with Fioricet.  She relocated to [place], [place] where her daughter resides and completed an additional three cycles of HD MTX every two weeks at [facility] beginning on 03/31/2009 (Dr. Vedia Coffer) with rituximab included with the fifth and sixth cycles.  MRI Brain on the 04/15/09 noted a persistent T1 signal hyperintensity in the splenium of the corpus callosum with scattered pericallosal foci of enhacement without new evidence of disease elsewhere.  MRI of the C/T/L-spine on the same day noted no definitive evidence of drop spinal metastasis.  MRI brain on 05/15/09 noted slight decrease in the enhancing lesion of the splenium with MRI of the C/T/L-spine on the same day negative for spinal disease.  She continued with monthly HD MTX and rituximab for at least three cycles.  MRI brain on 07/08/09 and 09/02/09 noted positive response to therapy without any definitive abnormal enhancement in the splenium of the corpus callosum.  Of note her, case was reviewed at the [facility] in 2010 for possible auto-transplant following induction therapy however patient did not follow up.        She subsequently moved back to [place], it is unclear if she had any further monitoring in the interim however was found to have relapse of her disease in 08/2013 after presenting to [facility] with 3 months of falls and headaches and confusion.  Imaging noted a right basal ganglia lesion.  She was transferred to [facility] where she underwent a total of six cycles of HD MTX (6g/m2 with first dose, 4 g/m2 with subsequent doses) with the last four cycles including rituximab.  Her hospital stay was reportedly complicated by confusoin and impulsivity.  MRI at the end of treatment on 11/01/2013 noted decrease in right basal ganglia lesion compared to prior studies suggestive of treatment response.  She was followed as an outpatient by Dr. Beverly Milch at [facility] and had  surveilance brain MRI in 08/2015 which noted decreased total volume of enhancing right basal ganglia tissue and encephalomalacia in the tumor bed consistent and resolution of prior punctate focus of left frontal lobe with response to therapy."    ROS: All systems reviewed, negative except as noted above and per HPI.    POcHx: Denies    OcMeds:   Denies    PMHx:    CNS lymphoma diagnosed 2010; states her next appointment with oncologist is 4/11   - currently in remission per patient   - Dr. Chancy Milroy at [facility]   - per Epic, patient no showed to appointments with neurosurgery 03/2010    SCHEDULED MEDICATIONS:   Bactrim DS 800 mg-160 mg oral tablet 1 tab PO Q12 Hours  nicotine 21 mg/24 hr transdermal film, extended release 1 patch Transdermal Daily  Valtrex 500 mg PO Q12 Hours  Fluconazole    PRN MEDICATIONS:    oxyCODONE 5-10 mg PO Q3 Hours PRN    ALL:    Review of patient's allergies indicates:  No Known Allergies    FamHx:  No blindness, glaucoma, macular degeneration, retinal detachments.     SocHx:   Lives in [place], [place].   Works as: used to work as Network engineer, now unemployed  Tob:  74 pack year smoking history, current smoker  ETOH: heavy abuse years ago, currently not drinking  Recreational drug use: marijuana use weeks ago, methamphetamines months ago (snorted), denies IVDU      PHYSICAL EXAM:   Eyes: See Ophth Exam Section   Neuro/Psych: See Ophth Exam Section   Constitutional: Notable for the following: No acute distress   Musculoskeletal: head is normocepalic, no skeletal abnormalities of upper extremities  Skin: No facial rashes  TOI:[id number] ears and nose are normal in appearance  Respiratory: Normal respiratory effort    B-Scan (08/26/16, Sweeney/Ho)  OD: diffuse loculated vitritis, no SRF, no RT/RD  OS: diffuse loculated vitritis, no SRF, no RT/RD    MRI Brain (08/27/16)   IMPRESSION:  1. No acute intracranial abnormality or evidence of recurrent lymphoma.  2. Inferior right basal ganglia  encephalomalacia, which comes in close approximation to the right optic tract.  3. Moderate cerebral volume loss and confluent periventricular FLAIR signal hyperintensity which may represents accommodation of microvascular ischemic change and post treatment effect.  4. Fluid within the bilateral maxillary and sphenoid sinuses raises the possibility of acute sinusitis.    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 Erline Levine) who also agrees that the patient's findings are highly consistent with local recurrence of CNS lymphoma in her eyes. Of note, patient underwent sterotactic biopsy on 01/29/09 revealing CD20 positive DLBCL. Given established tissue diagnosis and consistent eye disease, further pursuit of tissue diagnosis with vitreous sample is not warranted and will not be pursued at this time. Rather, ocular disease should be addressed with local therapies as outlined below per radiation oncology or with intravitreal rituxumab  by our service if radiation oncology not amenable to ocular irradiation. Ophthalmology favors ocular irradiation.  - Recommend radiation oncology consult  - Less likely are infectious etiologies, and if we are considering infection most likely culprit would be fungal given chronicity. Viral and bacterial etiologies would be highly unlikely given time course lack of systemic symptoms and no pain on presentation. Will await the results of infectious work-up to return negative before proceeding with lymphoma treatment plan.  - S/p IV injection of foscarnet and voriconazole (Ho/Weinstein) 08/26/2016  - Continue Oflox right eye QID x 1 week   - Follow-up ACE, CXR, Quant, toxo, HIV, blood cultures  - Tentative plan for lumbar puncture tomorrow  (08/29/16)    Seen with Dr. Imagene Riches and Dr. Vick Frees. Discussed case with Ocular Oncologist, Dr. Erline Levine.     Serina Cowper, MD  PGY-2 Ophthalmology    Oncologist  Dr. [facility] [place] [address]    Best contacts  Butch Penny (friend who pt lives with): [phone]  Patient: [phone]  Carleene Mains (aunt): [phone]    Referring:  [facility]    PCP:    Pcp, Unknown

## 2016-08-28 NOTE — Progress Notes (Signed)
SD-OCT performed in clinic, BE macula & posterior pole w/Poor cooperation.  Images/reports stored on [employer].     Attempted fundus photos BOTH EYES, poor fixation/quality due to symptoms and vision.

## 2016-08-28 NOTE — Progress Notes (Addendum)
Seen with Dr. Vick Frees and discussed with Dr. Erline Levine.    I saw and evaluated this patient. I have reviewed and edited the resident's note as necessary, and I agree with the findings as documented. I have personally reviewed all imaging studies performed on this date of service. I was present and immediately available for all procedures performed on this date of service.

## 2016-08-29 ENCOUNTER — Other Ambulatory Visit: Payer: Self-pay | Admitting: Internal Medicine

## 2016-08-29 ENCOUNTER — Encounter (HOSPITAL_BASED_OUTPATIENT_CLINIC_OR_DEPARTMENT_OTHER): Payer: Self-pay | Admitting: Student in an Organized Health Care Education/Training Program

## 2016-08-29 ENCOUNTER — Other Ambulatory Visit (HOSPITAL_BASED_OUTPATIENT_CLINIC_OR_DEPARTMENT_OTHER): Payer: Self-pay | Admitting: Internal Medicine

## 2016-08-29 DIAGNOSIS — H539 Unspecified visual disturbance: Secondary | ICD-10-CM

## 2016-08-29 DIAGNOSIS — C8339 Diffuse large B-cell lymphoma, extranodal and solid organ sites: Secondary | ICD-10-CM

## 2016-08-29 DIAGNOSIS — H543 Unqualified visual loss, both eyes: Secondary | ICD-10-CM

## 2016-08-29 LAB — CELL COUNT AND DIFF, CSF
% Basophils, Fluid: 0 %
% Eosinophils, Fluid: 0 %
% Lymphocytes, Fluid: 65 %
% Macrophages, Fluid: 35 %
% Neutrophils, Fluid: 0 %
% Unclassified Cells, Fluid: 0 %
CSF Nucleated Cells: 5 {cells}/uL (ref 0–5)
CSF RBC: 24 {cells}/uL — ABNORMAL HIGH (ref 0–5)
No. of Cells Counted for Diff: 100 {cells}
Tube Number: 4

## 2016-08-29 LAB — CSF C/C W/GRAM ORDER

## 2016-08-29 LAB — ANGIOTENSIN CONVERTING ENZYME: Angiotensin Converting Enzyme (U/L) (Sendout): 38 U/L (ref 8–53)

## 2016-08-29 LAB — GLUCOSE, CSF: Glucose, CSF: 62 mg/dL (ref 40–80)

## 2016-08-29 LAB — PROTEIN (TOTAL), CSF: Protein (Total), Csf: 64 mg/dL — ABNORMAL HIGH (ref 15–45)

## 2016-08-29 NOTE — Progress Notes (Signed)
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 Patient is a very poor historian and gets frustrated when asked about details. She states that vision loss was sudden and that she saw an Ophthalmologist Dr. Arbie Cookey at [facility], who referred her to a laser and eye center in [place] for cataract surgery. At that time, she was told to go to [facility] and urgently referred to Dr. Karren Cobble. 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 Right eye was tapped and injected with foscarnet and voriconazole with samples sent for viral, bacterial, and fungal PCR. Subsequently, patient was started on empiric fluconazole, Valtrex, and Bactrim in addition to testing for TB, syphilis, toxoplasmosis, and HIV. She was then admitted on 08/26/16 for expedited workup including MRI and LP.      Interval Events (08/29/16):  - Planned LP later today  - Patient states that she is doing well. She denies any pain or changes in her vision as compared to yesterday.     Oncology Course (Per Heme/Onc Note dated 08/27/16): "With regards to her PCNSL history, she first presented to [facility] in [place], [place] in 01/2009 with two week history of headaches, short term memory loss, and difficulty ambulating. 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 She was started on HD MTX (8g/m2) and underwent 3 cycles on 02/06/2009, 02/16/2018, 03/02/2009 with MRI following her second cycle revealing near complete resolution of her lesions.  She generally tolerated treatment well  with side effects of nausea and headches relieved with Fioricet.  She relocated to [place], [place] where her daughter resides and completed an additional three cycles of HD MTX every two weeks at [facility] beginning on 03/31/2009 (Dr. Vedia Coffer) with rituximab included with the fifth and sixth cycles.  MRI Brain on the 04/15/09 noted a persistent T1 signal hyperintensity in the splenium of the corpus callosum with scattered pericallosal foci of enhacement without new evidence of disease elsewhere.  MRI of the C/T/L-spine on the same day noted no definitive evidence of drop spinal metastasis.  MRI brain on 05/15/09 noted slight decrease in the enhancing lesion of the splenium with MRI of the C/T/L-spine on the same day negative for spinal disease.  She continued with monthly HD MTX and rituximab for at least three cycles.  MRI brain on 07/08/09 and 09/02/09 noted positive response to therapy without any definitive abnormal enhancement in the splenium of the corpus callosum.  Of note her, case was reviewed at the [facility] in 2010 for possible auto-transplant following induction therapy however patient did not follow up.        She subsequently moved back to [place], it is unclear if she had any further monitoring in the interim however was found to have relapse of her disease in 08/2013 after presenting to [facility] with 3 months of falls and headaches and confusion.  Imaging noted a right basal ganglia lesion.  She was transferred to [facility] where she underwent a total of six cycles of HD MTX (6g/m2 with first dose, 4 g/m2 with subsequent doses) with  the last four cycles including rituximab.  Her hospital stay was reportedly complicated by confusoin and impulsivity.  MRI at the end of treatment on 11/01/2013 noted decrease in right basal ganglia lesion compared to prior studies suggestive of treatment response.  She was followed as an outpatient by Dr. Beverly Milch at [facility] and had surveilance brain MRI in 08/2015 which noted decreased total volume of enhancing right basal ganglia tissue and encephalomalacia in the tumor bed consistent and resolution of prior punctate focus of left frontal lobe with response to therapy."    ROS: All systems reviewed, negative except as noted above and per HPI.    POcHx: Denies    OcMeds:   Denies    PMHx:    CNS lymphoma diagnosed 2010; states her next appointment with oncologist is 4/11   - currently in remission per patient   - Dr. Chancy Milroy at [facility]   - per Epic, patient no showed to appointments with neurosurgery 03/2010    PRN MEDICATIONS:    oxyCODONE 5-10 mg PO Q3 Hours PRN    ALL:    Review of patient's allergies indicates:  No Known Allergies    FamHx:  No blindness, glaucoma, macular degeneration, retinal detachments.     SocHx:   Lives in [place], [place].   Works as: used to work as Network engineer, now unemployed  Tob:  81 pack year smoking history, current smoker  ETOH: heavy abuse years ago, currently not drinking  Recreational drug use: marijuana use weeks ago, methamphetamines months ago (snorted), denies IVDU      PHYSICAL EXAM:   Eyes: See Ophth Exam Section   Neuro/Psych: See Ophth Exam Section   Constitutional: Notable for the following: No acute distress   Musculoskeletal: head is normocepalic, no skeletal abnormalities of upper extremities  Skin: No facial rashes  DGL:[id number] ears and nose are normal in appearance  Respiratory: Normal respiratory effort    B-Scan (08/26/16, Sweeney/Ho)  OD: diffuse loculated vitritis, no SRF, no RT/RD  OS: diffuse loculated vitritis, no SRF, no RT/RD    MRI Brain (08/27/16)   IMPRESSION:  1. No acute intracranial abnormality or evidence of recurrent lymphoma.  2. Inferior right basal ganglia encephalomalacia, which comes in close approximation to the right optic tract.  3. Moderate cerebral volume loss and confluent periventricular FLAIR signal hyperintensity which may represents accommodation of  microvascular ischemic change and post treatment effect.  4. Fluid within the bilateral maxillary and sphenoid sinuses raises the possibility of acute sinusitis.    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 Erline Levine) who also agrees that the patient's findings are highly consistent with local recurrence of CNS lymphoma in her eyes. Of note, patient underwent sterotactic biopsy on 01/29/09 revealing CD20 positive DLBCL. Given established tissue diagnosis and consistent eye disease, further pursuit of tissue diagnosis with vitreous sample is not warranted and will not be pursued at this time. Rather, ocular disease should be addressed with local therapies as outlined below per radiation oncology or with intravitreal rituxumab by our service if radiation oncology not  amenable to ocular irradiation. Ophthalmology favors ocular irradiation.  - Appreciate radiation oncology consult  - Less likely are infectious etiologies, and if we are considering infection most likely culprit would be fungal given chronicity. Viral and bacterial etiologies would be highly unlikely given time course lack of systemic symptoms and no pain on presentation. Will await the results of infectious work-up including lumbar puncture before proceeding with lymphoma treatment plan.  - S/p IV injection of foscarnet and voriconazole (Ho/Weinstein) 08/26/2016  - Tentative plan for lumbar puncture today (08/29/16)  - Started atropine daily OU to keep eyes dilated  - Will repeat exam in 2-3 days    Discussed with retina fellow, Dr. Imagene Riches.      Serina Cowper, MD  PGY-2 Ophthalmology    Oncologist  Dr[facility] [place] [address]    Best contacts  Butch Penny (friend who pt lives with): [phone]  Patient: [phone]    PCP:    Pcp, Unknown

## 2016-08-29 NOTE — Progress Notes (Signed)
The resident chose to discuss the management of the patient with me but I did not personally evaluate the patient. I was immediately available for assistance in the care of this patient. I have reviewed and edited the resident's note and interpretations as necessary, and I agree with the findings as documented.

## 2016-08-30 LAB — FLOW CYTOMETRY PANEL

## 2016-08-30 LAB — BACTERIAL DETECTION BY PCR: Culture: NOT DETECTED

## 2016-08-30 LAB — FUNGAL DETECTION BY PCR

## 2016-08-30 LAB — TOXOPLASMA GONDII BY PCR

## 2016-08-31 ENCOUNTER — Other Ambulatory Visit: Payer: Self-pay | Admitting: Radiation Oncology

## 2016-08-31 ENCOUNTER — Ambulatory Visit (HOSPITAL_BASED_OUTPATIENT_CLINIC_OR_DEPARTMENT_OTHER): Payer: Medicare Other | Admitting: Radiation Oncology

## 2016-08-31 ENCOUNTER — Ambulatory Visit: Payer: Medicare Other | Admitting: Radiation Oncology

## 2016-08-31 DIAGNOSIS — C8591 Non-Hodgkin lymphoma, unspecified, lymph nodes of head, face, and neck: Secondary | ICD-10-CM

## 2016-08-31 DIAGNOSIS — C6962 Malignant neoplasm of left orbit: Secondary | ICD-10-CM

## 2016-08-31 DIAGNOSIS — C6961 Malignant neoplasm of right orbit: Secondary | ICD-10-CM

## 2016-08-31 DIAGNOSIS — C8331 Diffuse large B-cell lymphoma, lymph nodes of head, face, and neck: Secondary | ICD-10-CM

## 2016-08-31 LAB — QUANTIFERON TB TEST
Quantiferon TB Ag Result: 0.062 IU gamma IF/mL
Quantiferon TB Interpretation: NEGATIVE
Quantiferon TB MIT Result: >10 IU gamma IF/mL
Quantiferon TB Nil Result: 0.047 IU gamma IF/mL

## 2016-08-31 LAB — LAB ADD ON ORDER

## 2016-08-31 LAB — BLOOD C/S (LINE): Culture: NO GROWTH

## 2016-08-31 LAB — SURFACE MARKER PROF (12 AB)

## 2016-09-01 ENCOUNTER — Ambulatory Visit: Payer: Medicare Other

## 2016-09-01 ENCOUNTER — Encounter (HOSPITAL_BASED_OUTPATIENT_CLINIC_OR_DEPARTMENT_OTHER): Payer: Self-pay | Admitting: Student in an Organized Health Care Education/Training Program

## 2016-09-01 DIAGNOSIS — C6961 Malignant neoplasm of right orbit: Secondary | ICD-10-CM

## 2016-09-01 DIAGNOSIS — H543 Unqualified visual loss, both eyes: Secondary | ICD-10-CM

## 2016-09-01 DIAGNOSIS — R110 Nausea: Secondary | ICD-10-CM

## 2016-09-01 DIAGNOSIS — R51 Headache: Secondary | ICD-10-CM

## 2016-09-01 DIAGNOSIS — C8331 Diffuse large B-cell lymphoma, lymph nodes of head, face, and neck: Secondary | ICD-10-CM

## 2016-09-01 DIAGNOSIS — C6962 Malignant neoplasm of left orbit: Secondary | ICD-10-CM

## 2016-09-01 DIAGNOSIS — C8589 Other specified types of non-Hodgkin lymphoma, extranodal and solid organ sites: Secondary | ICD-10-CM

## 2016-09-01 NOTE — Progress Notes (Addendum)
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 Patient is a very poor historian and gets frustrated when asked about details. She states that vision loss was sudden and that she saw an Ophthalmologist Dr. Arbie Cookey at [facility], who referred her to a laser and eye center in [place] for cataract surgery. At that time, she was told to go to [facility] and urgently referred to Dr. Karren Cobble. 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 Right eye was tapped and injected with foscarnet and voriconazole with samples sent for viral, bacterial, and fungal PCR. Subsequently, patient was started on empiric fluconazole, Valtrex, and Bactrim in addition to testing for TB, syphilis, toxoplasmosis, and HIV. She was then admitted on 08/26/16 for expedited workup. Patient underwent LP on 08/29/16.     Interval Events (09/01/16):  This afternoon, patient states that she is doing okay, however not sleeping well in her room. She denies any changes in vision or eye pain. Denies any flashes or floaters. No other questions or concerns.     Oncology Course (Per Heme/Onc Note dated 08/27/16): "With regards to her PCNSL history, she first presented to [facility] in [place], [place] in 01/2009 with two week history of headaches, short term memory loss, and difficulty ambulating. 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 She was started on HD MTX (8g/m2) and underwent 3 cycles on 02/06/2009, 02/16/2018, 03/02/2009 with MRI following her second cycle revealing  near complete resolution of her lesions.  She generally tolerated treatment well with side effects of nausea and headches relieved with Fioricet.  She relocated to [place], [place] where her daughter resides and completed an additional three cycles of HD MTX every two weeks at [facility] beginning on 03/31/2009 (Dr. Vedia Coffer) with rituximab included with the fifth and sixth cycles.  MRI Brain on the 04/15/09 noted a persistent T1 signal hyperintensity in the splenium of the corpus callosum with scattered pericallosal foci of enhacement without new evidence of disease elsewhere.  MRI of the C/T/L-spine on the same day noted no definitive evidence of drop spinal metastasis.  MRI brain on 05/15/09 noted slight decrease in the enhancing lesion of the splenium with MRI of the C/T/L-spine on the same day negative for spinal disease.  She continued with monthly HD MTX and rituximab for at least three cycles.  MRI brain on 07/08/09 and 09/02/09 noted positive response to therapy without any definitive abnormal enhancement in the splenium of the corpus callosum.  Of note her, case was reviewed at the [facility] in 2010 for possible auto-transplant following induction therapy however patient did not follow up.      She subsequently moved back to [place], it is unclear if she had any further monitoring in the interim however was found to have relapse of her disease in 08/2013 after presenting to [facility] with 3 months of falls and headaches and confusion.  Imaging noted a right basal ganglia lesion.  She was transferred to [facility] where she underwent a total of six cycles of HD MTX (6g/m2 with first dose,  4 g/m2 with subsequent doses) with the last four cycles including rituximab.  Her hospital stay was reportedly complicated by confusoin and impulsivity.  MRI at the end of treatment on 11/01/2013 noted decrease in right basal ganglia lesion compared to prior studies suggestive of  treatment response.  She was followed as an outpatient by Dr. Beverly Milch at [facility] and had surveilance brain MRI in 08/2015 which noted decreased total volume of enhancing right basal ganglia tissue and encephalomalacia in the tumor bed consistent and resolution of prior punctate focus of left frontal lobe with response to therapy."    ROS: All systems reviewed, negative except as noted above and per HPI.    POcHx: Denies    OcMeds:   Denies    PMHx:    CNS lymphoma diagnosed 2010; states her next appointment with oncologist is 4/11   - currently in remission per patient   - Dr. Chancy Milroy at [facility]   - per Epic, patient no showed to appointments with neurosurgery 03/2010    PRN MEDICATIONS:    oxyCODONE 5-10 mg PO Q3 Hours PRN    ALL:    Review of patient's allergies indicates:  No Known Allergies    FamHx:  No blindness, glaucoma, macular degeneration, retinal detachments.     SocHx:   Lives in [place], [place].   Works as: used to work as Network engineer, now unemployed  Tob:  29 pack year smoking history, current smoker  ETOH: heavy abuse years ago, currently not drinking  Recreational drug use: marijuana use weeks ago, methamphetamines months ago (snorted), denies IVDU      PHYSICAL EXAM:   Eyes: See Ophth Exam Section   Neuro/Psych: See Ophth Exam Section   Constitutional: Notable for the following: No acute distress   Musculoskeletal: head is normocepalic, no skeletal abnormalities of upper extremities  Skin: No facial rashes  YBO:[id number] ears and nose are normal in appearance  Respiratory: Normal respiratory effort    B-Scan (08/26/16, Sweeney/Ho)  OD: diffuse loculated vitritis, no SRF, no RT/RD  OS: diffuse loculated vitritis, no SRF, no RT/RD    MRI Brain (08/27/16)   IMPRESSION:  1. No acute intracranial abnormality or evidence of recurrent lymphoma.  2. Inferior right basal ganglia encephalomalacia, which comes in close approximation to the right optic tract.  3. Moderate cerebral volume loss and confluent  periventricular FLAIR signal hyperintensity which may represents accommodation of microvascular ischemic change and post treatment effect.  4. Fluid within the bilateral maxillary and sphenoid sinuses raises the possibility of acute sinusitis.    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 Dilated fundus exam appears stable.  - Discussed case and reviewed images with Ocular Oncologist (Dr. Erline Levine) who also agrees that the patient's findings are highly consistent with local recurrence of CNS lymphoma in her eyes. Of note, patient underwent sterotactic biopsy on 01/29/09 revealing CD20 positive DLBCL.   - Patient was seen and evaluated by [facility] Radiation/Oncology Team on 08/31/16.   -  Plan for orbital/whole brain radiation therapy for 4 weeks, 5 days per week (Monday-Friday) at [facility], beginning Monday.  - Patient aware that the goal of treatment is to manage risk of associated mortality, and that VRL leads to irreversible damage of the retina/RPE which is unlikely to lead to visual recovery despite treatment.  - Greatly appreciate recs from rad/oncology and medicine team   - Follow-up in 4-6 weeks with [facility] (Dr. Erline Levine).     Discussed with retina fellow, Dr. Imagene Riches.     Serina Cowper, MD  PGY-2 Ophthalmology    Oncologist  [facility] [place] [place] [address]    Best contacts  Butch Penny (friend who pt lives with): [phone]  Patient: [phone]

## 2016-09-02 NOTE — Progress Notes (Addendum)
Case discussed and plan reviewed with Dr. Clement Husbands on 09/01/16.    The resident chose to discuss the management of the patient with me but I did not personally evaluate the patient. I was immediately available for assistance in the care of this patient. I have reviewed and edited the resident's note and interpretations as necessary, and I agree with the findings as documented.

## 2016-09-03 DIAGNOSIS — F17200 Nicotine dependence, unspecified, uncomplicated: Secondary | ICD-10-CM

## 2016-09-03 DIAGNOSIS — C8599 Non-Hodgkin lymphoma, unspecified, extranodal and solid organ sites: Secondary | ICD-10-CM

## 2016-09-04 ENCOUNTER — Telehealth (HOSPITAL_BASED_OUTPATIENT_CLINIC_OR_DEPARTMENT_OTHER): Payer: Self-pay | Admitting: Hematology & Oncology

## 2016-09-04 ENCOUNTER — Ambulatory Visit: Payer: Medicare Other | Attending: Radiation Oncology

## 2016-09-04 DIAGNOSIS — C6962 Malignant neoplasm of left orbit: Secondary | ICD-10-CM

## 2016-09-04 DIAGNOSIS — C8331 Diffuse large B-cell lymphoma, lymph nodes of head, face, and neck: Secondary | ICD-10-CM

## 2016-09-04 DIAGNOSIS — C6961 Malignant neoplasm of right orbit: Secondary | ICD-10-CM

## 2016-09-04 NOTE — Telephone Encounter (Signed)
Inpatient Discharge Follow-up Appointment  Notification ONLY, no response needed.     Name: Eileen [name]  MRN: [id number]  Inpatient Floor: 5E  Anticipated Discharge date: 09/04/2016  Disposition: SNF pending    Per Computerized Physician Order Entry (CPOE)  Diagnosis: hx of CNS lymphoma, now with vision loss  Provider: Volanda Napoleon MD, Megha[place]  Follow Up & Time Frame:   "[place] in 1 to 2 weeks  You should establish care with Dr. Wellington Hampshire at the [facility] center. The oncology team at [facility] has made a referral for you"    For more information please see the Discharge Readiness Tab in [facility].      Thank you,  Yetta Glassman  Scheduling Liaison   Complex Appointing Team  [place]

## 2016-09-05 ENCOUNTER — Ambulatory Visit: Payer: Medicare Other | Attending: Radiation Oncology

## 2016-09-05 DIAGNOSIS — C6961 Malignant neoplasm of right orbit: Secondary | ICD-10-CM | POA: Insufficient documentation

## 2016-09-05 DIAGNOSIS — C6962 Malignant neoplasm of left orbit: Secondary | ICD-10-CM | POA: Insufficient documentation

## 2016-09-05 DIAGNOSIS — C8331 Diffuse large B-cell lymphoma, lymph nodes of head, face, and neck: Secondary | ICD-10-CM | POA: Insufficient documentation

## 2016-09-05 LAB — CSF C/S W/GRAM
Culture: NO GROWTH
Gram Smear: NONE SEEN
Gram Smear: NONE SEEN

## 2016-09-05 NOTE — Telephone Encounter (Signed)
Hi Neha - for you.  Thanks.

## 2016-09-06 ENCOUNTER — Ambulatory Visit (HOSPITAL_BASED_OUTPATIENT_CLINIC_OR_DEPARTMENT_OTHER): Payer: Medicare Other

## 2016-09-07 ENCOUNTER — Ambulatory Visit (HOSPITAL_BASED_OUTPATIENT_CLINIC_OR_DEPARTMENT_OTHER): Payer: Medicare Other

## 2016-09-08 ENCOUNTER — Encounter (HOSPITAL_BASED_OUTPATIENT_CLINIC_OR_DEPARTMENT_OTHER): Payer: Self-pay | Admitting: Radiation Oncology

## 2016-09-08 ENCOUNTER — Non-Acute Institutional Stay (SKILLED_NURSING_FACILITY): Payer: Medicare Other | Admitting: Geriatric Medicine

## 2016-09-08 ENCOUNTER — Ambulatory Visit (HOSPITAL_BASED_OUTPATIENT_CLINIC_OR_DEPARTMENT_OTHER): Payer: Medicare Other

## 2016-09-08 ENCOUNTER — Encounter (HOSPITAL_BASED_OUTPATIENT_CLINIC_OR_DEPARTMENT_OTHER): Payer: Medicare Other | Admitting: Radiation Oncology

## 2016-09-08 DIAGNOSIS — F17200 Nicotine dependence, unspecified, uncomplicated: Secondary | ICD-10-CM

## 2016-09-08 DIAGNOSIS — R519 Headache, unspecified: Secondary | ICD-10-CM | POA: Insufficient documentation

## 2016-09-08 DIAGNOSIS — C6961 Malignant neoplasm of right orbit: Secondary | ICD-10-CM

## 2016-09-08 DIAGNOSIS — C8580 Other specified types of non-Hodgkin lymphoma, unspecified site: Secondary | ICD-10-CM

## 2016-09-08 DIAGNOSIS — H543 Unqualified visual loss, both eyes: Secondary | ICD-10-CM

## 2016-09-08 DIAGNOSIS — C6962 Malignant neoplasm of left orbit: Secondary | ICD-10-CM

## 2016-09-08 DIAGNOSIS — C8331 Diffuse large B-cell lymphoma, lymph nodes of head, face, and neck: Secondary | ICD-10-CM

## 2016-09-08 DIAGNOSIS — R32 Unspecified urinary incontinence: Secondary | ICD-10-CM | POA: Insufficient documentation

## 2016-09-08 LAB — TOXOPLASMA IGM (ELISA), SEROL: Toxoplasma IgM (Elisa): 0.4 (ref 0.0–1.6)
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
## 2016-09-08 NOTE — Weekly On Treatment Visit (Signed)
Ernestene Kiel         page 2/2  [id number]    DOS APR  [place]  Patient:[place], [place] B.     [place]  DOB:  JAN 31,1957   Age:  61     Diagnosis:  Malignant neoplasm of right orbit  ICD-10:   C69.61  [facility]  Performing:  Dorian Pod        Dept Radiation Oncology  Date of Service:  APR  6,2018  [facility] Finders of [place], [place]        On Treatment Weekly Visit     Treatment Summary:  Radiation Oncology - Course: 1     Protocol:   Treatment Site  Current Dose  Modality  From  To  Elapsed Days  Fx.  Whole brain     720 cGy  x06   09/04/2016   09/07/2016    4   4  orbits     Xrays                                         Subjective:  Is very upset with her SNF as they do not permit her to smoke, she only has Tylenol   for PRN pain medication. +Nausea, but able to eat. No weakness. No change in her vision (just   shapes   and light). Constant HA 7/10 x 3 days, constant. She wonders how much longer she will have to be   at the SNF, whether her eye sight will come back.     General:   T 36.4    HR 73    BP 123/65    95% RA  Sitting in wheelchair in NAD     G0 dermatitis; no alopecia     Other:       Assessment and Plan:   Tolerating RT  continue as planned   Nursing to call SNF  on discharge, she was prescribed oxycodone, so unclear whether this is not   being offered. Ms. Daleo also asks if there can be something prescribed for her nerves. We will   also chat with them about consideration of taking her out to smoke. She desperately does not want   to be in the SNF and is frustrated that she still has 3 weeks additional of RT   Last, Ms. Clagett was very clear with me that for her QOL is more important than quantity of   life. QOL for her is to be with her daughter and grandchildren in [place]. We spoke about foregoing RT,   which she is in favor of, but I defer this final discussion to Dr. Linward Natal. I told her that it is    very reasonable priority, although a plan has to also be in place to safely get her from [place] to   [facility].         CBCT/Port films were reviewed.     Dosimetry treatment parameters were reviewed.     Patient treatment setup was reviewed        Resident:  Dorian Pod     Attending Presence:  I personally evaluated and examined this patient today with the resident   physician  and concur with the stated findings, assessment and plan.     This document is electronically signed by Dorian Pod on APR  [phone].  Document status is Approved.

## 2016-09-10 LAB — BLOOD FUNGAL CULTURE

## 2016-09-11 ENCOUNTER — Ambulatory Visit (HOSPITAL_BASED_OUTPATIENT_CLINIC_OR_DEPARTMENT_OTHER): Payer: Medicare Other

## 2016-09-12 ENCOUNTER — Ambulatory Visit (HOSPITAL_BASED_OUTPATIENT_CLINIC_OR_DEPARTMENT_OTHER): Payer: Medicare Other

## 2016-09-13 ENCOUNTER — Encounter (HOSPITAL_BASED_OUTPATIENT_CLINIC_OR_DEPARTMENT_OTHER): Payer: Self-pay | Admitting: Radiation Oncology

## 2016-09-13 ENCOUNTER — Ambulatory Visit (HOSPITAL_BASED_OUTPATIENT_CLINIC_OR_DEPARTMENT_OTHER): Payer: Medicare Other

## 2016-09-13 ENCOUNTER — Encounter (HOSPITAL_BASED_OUTPATIENT_CLINIC_OR_DEPARTMENT_OTHER): Payer: Medicare Other | Admitting: Radiation Oncology

## 2016-09-13 NOTE — Weekly On Treatment Visit (Signed)
Ernestene Kiel         page 2/2  [id number]    DOS APR [phone]           [place]  [facility] [place]  Patient:[name], [name]     [phone]     MEDICAL  CENTER  DOB:  JAN 31,1957   Age:  61     Diagnosis:  Malignant neoplasm of right orbit  ICD-10:   C69.61  [facility]  Performing:  Yolande Jolly        Dept Radiation Oncology  Date of Service:  APR 11,2018  [facility]ene Finders of [place], [place]        On Treatment Weekly Visit     Treatment Summary:  Radiation Oncology - Course: 1     Protocol:   Treatment Site  Current Dose  Modality  From  To  Elapsed Days  Fx.  Whole brain   1,440 cGy  x06   09/04/2016   09/13/2016    9   8  orbits     Xrays                                         Subjective:  Still unhappy with SNF; reports all the rules are difficult for her to get   accustomed to.  C/o stomach upset today, worried she will vomit.  Reports she took pain medication   in AM but   no anti-nausea.  Her vision is not improving or worsening.  Has dry eyes, worse on the left.    Denies weakness or hearing changes.     General:   T 37    HR 74    BP 118/81    98% RA   Sitting in wheelchair in NAD      G0 dermatitis; no alopecia         Assessment and Plan:     Tolerating RT  continue as planned     Nursing to call SNF  we wrote for scheduled 8mg  Zofran before treatment, as well as saline   drops.  Gave her PO Zofran in clinic    Counseled pt it is too early to tell whether eyesight will return           CBCT/Port films were reviewed.     Dosimetry treatment parameters were reviewed.     Patient treatment setup was reviewed        Resident:  Alieyah Spader     Attending Presence:  I personally evaluated and examined this patient today with the resident   physician and concur with the stated findings, assessment and plan.     This document is electronically signed by Yolande Jolly on APR [phone].  Document status is Approved.

## 2016-09-14 ENCOUNTER — Ambulatory Visit (HOSPITAL_BASED_OUTPATIENT_CLINIC_OR_DEPARTMENT_OTHER): Payer: Medicare Other

## 2016-09-15 ENCOUNTER — Ambulatory Visit (HOSPITAL_BASED_OUTPATIENT_CLINIC_OR_DEPARTMENT_OTHER): Payer: Medicare Other

## 2016-09-15 DIAGNOSIS — C6961 Malignant neoplasm of right orbit: Secondary | ICD-10-CM

## 2016-09-15 DIAGNOSIS — C8331 Diffuse large B-cell lymphoma, lymph nodes of head, face, and neck: Secondary | ICD-10-CM

## 2016-09-15 DIAGNOSIS — C6962 Malignant neoplasm of left orbit: Secondary | ICD-10-CM

## 2016-09-18 ENCOUNTER — Ambulatory Visit (HOSPITAL_BASED_OUTPATIENT_CLINIC_OR_DEPARTMENT_OTHER): Payer: Medicare Other

## 2016-09-19 ENCOUNTER — Ambulatory Visit (HOSPITAL_BASED_OUTPATIENT_CLINIC_OR_DEPARTMENT_OTHER): Payer: Medicare Other

## 2016-09-20 ENCOUNTER — Encounter (HOSPITAL_BASED_OUTPATIENT_CLINIC_OR_DEPARTMENT_OTHER): Payer: Self-pay | Admitting: Radiation Oncology

## 2016-09-20 ENCOUNTER — Encounter (HOSPITAL_BASED_OUTPATIENT_CLINIC_OR_DEPARTMENT_OTHER): Payer: Medicare Other | Admitting: Radiation Oncology

## 2016-09-20 ENCOUNTER — Ambulatory Visit (HOSPITAL_BASED_OUTPATIENT_CLINIC_OR_DEPARTMENT_OTHER): Payer: Medicare Other

## 2016-09-20 NOTE — Weekly On Treatment Visit (Signed)
**Note Eileen-Identified via Obfuscation** Ernestene Kiel         page 2/2  [id number]    DOS APR [phone]           [place]  [facility] [place]  Patient:Eileen [name], Eileen [place] B.     [phone]     MEDICAL  CENTER  DOB:  JAN 31,1957   Age:  61     Diagnosis:  Malignant neoplasm of right orbit  ICD-10:   C69.61  [facility]  Performing:  Yolande Jolly        Dept Radiation Oncology  Date of Service:  APR 80,2233  [facility] Finders of [place], [place]        On Treatment Weekly Visit     Treatment Summary:  Radiation Oncology - Course: 1     Protocol:   Treatment Site  Current Dose  Modality  From  To  Elapsed Days  Fx.  Whole brain   2,340 cGy  x06   09/04/2016   09/20/2016   16  13  orbits     Xrays                                         Subjective:  Feels nauseated for the past few days. She reports not getting nausea medication   despite [place] calling the SNF last week with Zofran prescription. She has not thrown up. She also has   irritated eyes.        General:   NAD. 123/65 73 95% on RA T36.4   Skin: Mild erythema periorbital tissues     Assessment and Plan: Nausea and skin effects from whole brain RT.  -Start orbital boost tomorrow  -Wrote order for Zofran 8 mg po before coming to RT  -Wrote order for artificial tears OU tid  -Wrote order for applying aquaphor around eyes after radiation therapy and before bed  -Spoke with her daughter regarding pt returning to [place] after treatment ends. She will   follow up with her medical oncologist there for surveillance and management of late effects.        Port films were reviewed.     Dosimetry treatment parameters were reviewed.     Patient treatment setup was reviewed           This document is electronically signed by Yolande Jolly on APR [phone].  Document status is Approved.

## 2016-09-21 ENCOUNTER — Ambulatory Visit (HOSPITAL_BASED_OUTPATIENT_CLINIC_OR_DEPARTMENT_OTHER): Payer: Medicare Other

## 2016-09-21 DIAGNOSIS — C6961 Malignant neoplasm of right orbit: Secondary | ICD-10-CM

## 2016-09-21 DIAGNOSIS — C8331 Diffuse large B-cell lymphoma, lymph nodes of head, face, and neck: Secondary | ICD-10-CM

## 2016-09-21 DIAGNOSIS — C6962 Malignant neoplasm of left orbit: Secondary | ICD-10-CM

## 2016-09-22 ENCOUNTER — Ambulatory Visit (HOSPITAL_BASED_OUTPATIENT_CLINIC_OR_DEPARTMENT_OTHER): Payer: Medicare Other

## 2016-09-22 DIAGNOSIS — C6961 Malignant neoplasm of right orbit: Secondary | ICD-10-CM

## 2016-09-22 DIAGNOSIS — C6962 Malignant neoplasm of left orbit: Secondary | ICD-10-CM

## 2016-09-22 DIAGNOSIS — C8331 Diffuse large B-cell lymphoma, lymph nodes of head, face, and neck: Secondary | ICD-10-CM

## 2016-09-25 ENCOUNTER — Ambulatory Visit (HOSPITAL_BASED_OUTPATIENT_CLINIC_OR_DEPARTMENT_OTHER): Payer: Medicare Other

## 2016-09-26 ENCOUNTER — Ambulatory Visit (HOSPITAL_BASED_OUTPATIENT_CLINIC_OR_DEPARTMENT_OTHER): Payer: Medicare Other

## 2016-09-26 LAB — CSF FUNGAL CULTURE

## 2016-09-27 ENCOUNTER — Ambulatory Visit (HOSPITAL_BASED_OUTPATIENT_CLINIC_OR_DEPARTMENT_OTHER): Payer: Medicare Other

## 2016-09-27 ENCOUNTER — Encounter (HOSPITAL_BASED_OUTPATIENT_CLINIC_OR_DEPARTMENT_OTHER): Payer: Medicare Other | Admitting: Radiation Oncology

## 2016-09-27 ENCOUNTER — Encounter (HOSPITAL_BASED_OUTPATIENT_CLINIC_OR_DEPARTMENT_OTHER): Payer: Self-pay | Admitting: Radiation Oncology

## 2016-09-28 ENCOUNTER — Ambulatory Visit (HOSPITAL_BASED_OUTPATIENT_CLINIC_OR_DEPARTMENT_OTHER): Payer: Medicare Other

## 2016-09-28 DIAGNOSIS — C6961 Malignant neoplasm of right orbit: Secondary | ICD-10-CM

## 2016-09-28 DIAGNOSIS — C8331 Diffuse large B-cell lymphoma, lymph nodes of head, face, and neck: Secondary | ICD-10-CM

## 2016-09-28 DIAGNOSIS — C6962 Malignant neoplasm of left orbit: Secondary | ICD-10-CM

## 2016-09-29 ENCOUNTER — Non-Acute Institutional Stay (SKILLED_NURSING_FACILITY): Payer: Medicare Other | Admitting: Geriatric Medicine

## 2016-09-29 ENCOUNTER — Ambulatory Visit (HOSPITAL_BASED_OUTPATIENT_CLINIC_OR_DEPARTMENT_OTHER): Payer: Medicare Other

## 2016-09-29 ENCOUNTER — Encounter (HOSPITAL_BASED_OUTPATIENT_CLINIC_OR_DEPARTMENT_OTHER): Payer: Self-pay | Admitting: Radiation Oncology

## 2016-09-29 DIAGNOSIS — C8331 Diffuse large B-cell lymphoma, lymph nodes of head, face, and neck: Secondary | ICD-10-CM

## 2016-09-29 DIAGNOSIS — C6961 Malignant neoplasm of right orbit: Secondary | ICD-10-CM

## 2016-09-29 DIAGNOSIS — F5101 Primary insomnia: Secondary | ICD-10-CM

## 2016-09-29 DIAGNOSIS — C6962 Malignant neoplasm of left orbit: Secondary | ICD-10-CM

## 2016-09-29 DIAGNOSIS — G44219 Episodic tension-type headache, not intractable: Secondary | ICD-10-CM

## 2016-09-29 NOTE — Weekly On Treatment Visit (Signed)
Eileen [name]         page 2/2  [id number]    DOS APR [place]  Patient:Eileen [name], Eileen Grant B.     [phone]     MEDICAL  CENTER  DOB:  JAN 31,1957   Age:  61     Diagnosis:  Malignant neoplasm of right orbit  ICD-10:   C69.61  [facility]  Performing:  Yolande Jolly        Dept Radiation Oncology  Date of Service:  APR 25,2018  [facility] Finders of [place], [place]        On Treatment Weekly Visit     Treatment Summary:  Radiation Oncology - Course: 1     Protocol:   Treatment Site  Current Dose  Modality  From  To  Elapsed Days  Fx.  Whole brain   2,340 cGy  x06   09/04/2016   09/20/2016   16  13  orbits     900 cGy  Xrays   09/21/2016   09/27/2016    7   5                             Subjective:  Pt reports a somewhat better week this week with more nausea control, as she is taking   medication before treatment.  Her skin is more irritated around her eyes and she is using   aquaphor.  Eating well, has occasional headaches.  No improvement or worsening in vision; can see   shapes and shadows.  Using artificial tears.  Her daughter will arrive next week and they will   return to [facility] on 10/06/16.  Has f/u with med onc there.        Vitals: T36.6, HR 72, BP 103/69  General:  Ill appearing female sitting in wheelchair   Skin: Erythema around eyes, no breakdown           Assessment and Plan:  Tolerating XRT reasonably well  Finish course of treatment Friday  Send records to new caregiver           CBCT/Port films were reviewed.     Dosimetry treatment parameters were reviewed.     Patient treatment setup was reviewed        Resident:  Anae Hams     Attending Presence:  I personally evaluated and examined this patient today with the resident   physician and concur with the stated findings, assessment and plan.     This document is electronically signed by Yolande Jolly on APR [phone].  Document status is Approved.

## 2016-10-02 NOTE — End Of treatment Summary (Signed)
Eileen [name]   2/2        [facility] Radiation Oncology Treatment Summary Note     Identifying History:  61 year old female with a history of primary CNS lymphoma dx 2010 s/p HD MTX and Rituxan with   relapse, causing near complete loss of vision.  Treating to 36Gy.      Treatment Summary:  Radiation Oncology - Course: 1     Protocol:   Treatment Site  Current Dose  Modality  From  To  Elapsed Days  Fx.  Whole brain   2,340 cGy  x06   09/04/2016   09/20/2016   16  13  orbits   1,260 cGy  Xrays   09/21/2016   09/29/2016    8   7                                Total Dose: Entered Manually        Treatment Intent:  Palliative (other)        Protocol:  none        Concurrent Chemotherapy: none        Treatment Tolerance/Response:  [name] struggled with nausea, vomiting and fatigue during   radiation.  This improved somewhat with premedication with Zofran.  She also experienced irritation   and dermatitis in the periorbital region.        Disposition:  Transferring care to [place], as she is moving there with her daughter  Send care records to office of Dr. Vedia Coffer  Call our clinic if questions arise           cc:  Melvern Sample.

## 2016-10-03 DIAGNOSIS — F5101 Primary insomnia: Secondary | ICD-10-CM | POA: Insufficient documentation

## 2016-10-03 MED ORDER — REFRESH LACRI-LUBE OP OINT
0.5000 [in_us] | TOPICAL_OINTMENT | Freq: Two times a day (BID) | OPHTHALMIC | 0 refills | Status: AC | PRN
Start: 2016-10-03 — End: ?

## 2016-10-03 MED ORDER — ONDANSETRON HCL 8 MG OR TABS
8.0000 mg | ORAL_TABLET | Freq: Three times a day (TID) | ORAL | 0 refills | Status: AC | PRN
Start: 2016-10-03 — End: ?

## 2016-10-03 MED ORDER — MELATONIN 3 MG OR TABS
3.0000 mg | ORAL_TABLET | Freq: Every evening | ORAL | 0 refills | Status: AC
Start: 2016-10-03 — End: ?

## 2016-10-03 MED ORDER — NAPROXEN 500 MG OR TABS
500.0000 mg | ORAL_TABLET | Freq: Two times a day (BID) | ORAL | 0 refills | Status: DC | PRN
Start: 2016-10-03 — End: 2016-11-05
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
## 2016-10-06 ENCOUNTER — Ambulatory Visit: Payer: Self-pay

## 2016-10-06 NOTE — Telephone Encounter (Signed)
Just spoke with doctor Von Preyss.  Needs the lidocaine patch strength clarification.      17:48  Dr. Sharyne Peach paged    17:49  Dr. Sharyne Peach advised of the above and call forwarded to the facility.        Reason for Disposition   Caller has URGENT medication question about med that PCP prescribed and triager unable to answer question    Protocols used: MEDICATION QUESTION CALL-ADULT-AH

## 2016-10-06 NOTE — Telephone Encounter (Signed)
Regarding: [facility]: needs Lidocaine strength for order, knee pain   ----- Message from Gloris Ham sent at 10/06/2016  5:32 PM PDT -----  [facility]: needs Lidocaine strength for order, knee pain

## 2016-10-10 ENCOUNTER — Encounter (HOSPITAL_BASED_OUTPATIENT_CLINIC_OR_DEPARTMENT_OTHER): Payer: Medicare Other | Admitting: Ophthalmology

## 2016-10-11 ENCOUNTER — Telehealth (HOSPITAL_BASED_OUTPATIENT_CLINIC_OR_DEPARTMENT_OTHER): Payer: Self-pay | Admitting: Geriatric Medicine

## 2016-10-11 NOTE — Telephone Encounter (Signed)
(  TEXTING IS AN OPTION FOR [facility] CLINICS ONLY)  Is this a [place] clinic? No      RETURN CALL: Detailed message on voicemail only      SUBJECT:  General Message     REASON FOR REQUEST: Looking for a call from [place]: Patient's caregiver would like a call from [place]. Please call back.

## 2016-10-16 ENCOUNTER — Telehealth (HOSPITAL_BASED_OUTPATIENT_CLINIC_OR_DEPARTMENT_OTHER): Payer: Self-pay | Admitting: Pain Medicine

## 2016-10-16 NOTE — Telephone Encounter (Signed)
This patient is being discharged from one of [facility]'s inpatient units. The inpatient provider has ordered follow-up for this patient in your clinic.     Name: Eileen [name]  MRN: [id number]  Inpatient Floor: [facility]  Anticipated Discharge date: 10/16/16  Disposition: Skilled Nursing    Per [facility] Powerchart:  Diagnosis: Vision loss, bilateral  Primary CNS lymphoma  Tobacco dependence  Headache  Urinary incontinence    Discharge Provider: Volanda Napoleon MD, Garret Reddish      Follow Up & Time Frame: 5/8 appt missed, per inpatient RN, please reschedule with Dr. Erline Levine. Follow up in [facility] with Dr. Erline Levine      Action Needed: clinic to schedule    This message is intended as a notification only. Please do not send responses to this message. For questions or feedback regarding this patient's care, please contact the inpatient unit.

## 2016-10-20 ENCOUNTER — Non-Acute Institutional Stay (SKILLED_NURSING_FACILITY): Payer: Medicare Other | Admitting: Geriatric Medicine

## 2016-10-20 DIAGNOSIS — C6961 Malignant neoplasm of right orbit: Secondary | ICD-10-CM

## 2016-10-20 DIAGNOSIS — IMO0001 Reserved for inherently not codable concepts without codable children: Secondary | ICD-10-CM

## 2016-10-20 DIAGNOSIS — Z598 Other problems related to housing and economic circumstances: Secondary | ICD-10-CM

## 2016-10-20 DIAGNOSIS — H543 Unqualified visual loss, both eyes: Secondary | ICD-10-CM

## 2016-10-22 NOTE — Progress Notes (Signed)
[place] [facility] [place] (SNF):   [facility])  [address] 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 She was admitted to [facility] from 3/24  to 4/2.  She is now at [facility] as part of [place] program since 09/04/16 to receive the of her radiation therapy..    She has completed her radiation therapy, has not regained any vision.  She still has plans to go to [place] to be w/ her daughter, though it appears from her report that she needs her ID and her dog, both are back in her home in [place].  Otherwise she has been doing "fine" since, she does report a bit of sadness that she hasn't left as she had originally anticipated.      In our [facility] BED READINESS and [facility] weekly telephone conferences, the pt's daughter had not returned numerous telephone calls and could not be reached to coordinate discharge back to [place].         ROS: NO  f/c/s, no eye pain. POSITIVE for intermittent headaches and nausea.    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  Please call the facility to have latest medication administration record faxed.      Physical Exam:  BP 126/62    Pulse 72    Temp 96.4 F (35.8 C)    Resp 18    Wt 175 lb 6 oz (79.5 kg)   Wt Readings from Last 3 Encounters:   10/20/16 175 lb 6 oz (79.5 kg)     GEN: appears older than stated age, accompanied by no one. Appears nourished, adequately hydrated,  and groomed. Alert and responding appropriately and in no acute distress lying flat in bed  PSYCH: alert and oriented, interactive, speech w/o dysarthria, full affect, focused content, logical process, no hallucinations or psychomotor agitation, but irritable, cursing at times.   HEENT: NCAT, NL external ears. Poor dentition, moist mucous membranes, oropharynx clear. EOMI and anicteric, nocorrective lens.   PULM: normal, unlabored, and regular respiratory effort,   MSK: kyphotic, changes in hands consistent w/ OA.   EXT: no c/c/e.   NEURO: MAE, no tremors, no focal deficits, normal tone, motor symmetry. SKIN: dry wnl temp and color, no apparent rash    Labs: nursing home labs not done through [facility] system, please check with the facility.      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       (Z59.8) Moving to new residence  Plan: pending care care coordination w/ family in [place].         The patient's risk is normal.  Problem category: is not new, Moderate problem.  The medical decision making is Low.      Toniann Fail, MD    Please contact the [facility] [place] at [phone] or page the provider or the on-call provider through [facility] at [phone].

## 2016-10-31 ENCOUNTER — Encounter: Payer: Self-pay | Admitting: Geriatric Medicine

## 2016-11-01 ENCOUNTER — Ambulatory Visit (HOSPITAL_BASED_OUTPATIENT_CLINIC_OR_DEPARTMENT_OTHER): Payer: Medicare Other | Attending: Ophthalmology | Admitting: Ophthalmology

## 2016-11-01 DIAGNOSIS — C8599 Non-Hodgkin lymphoma, unspecified, extranodal and solid organ sites: Secondary | ICD-10-CM

## 2016-11-01 DIAGNOSIS — C8590 Non-Hodgkin lymphoma, unspecified, unspecified site: Secondary | ICD-10-CM | POA: Insufficient documentation

## 2016-11-01 DIAGNOSIS — H543 Unqualified visual loss, both eyes: Secondary | ICD-10-CM | POA: Insufficient documentation

## 2016-11-01 DIAGNOSIS — H31003 Unspecified chorioretinal scars, bilateral: Secondary | ICD-10-CM | POA: Insufficient documentation

## 2016-11-01 NOTE — Progress Notes (Signed)
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 She was referred to Dr. 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 Right eye was tapped and injected with foscarnet and voriconazole with samples sent for viral, bacterial, and fungal PCR. Subsequently, patient was started on empiric fluconazole, Valtrex, and Bactrim in addition to testing for TB, syphilis, toxoplasmosis, and HIV.  She was then admitted on 08/26/16 for expedited workup including MRI and LP. She underwent EBRT to whole braine and bilateral orbits finishing 09/2016.     Oncology Course (Per Heme/Onc Note dated 08/27/16): "With regards to her PCNSL history, she first presented to [facility] in [place], [place] in 01/2009 with two week history of headaches, short term memory loss, and difficulty ambulating. 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 She was started on HD MTX (8g/m2) and underwent 3 cycles on 02/06/2009, 02/16/2018, 03/02/2009 with MRI following her second cycle revealing near complete resolution of her lesions.  She generally tolerated treatment well with side effects of nausea and headches relieved with Fioricet.  She relocated to [place], [place] where her daughter resides and completed an additional three cycles of HD MTX every two weeks at [facility] beginning on 03/31/2009 (Dr. Vedia Coffer) with rituximab included with the fifth and sixth cycles.  MRI Brain on the 04/15/09 noted a persistent T1 signal hyperintensity in the splenium of the corpus callosum with  scattered pericallosal foci of enhacement without new evidence of disease elsewhere.  MRI of the C/T/L-spine on the same day noted no definitive evidence of drop spinal metastasis.  MRI brain on 05/15/09 noted slight decrease in the enhancing lesion of the splenium with MRI of the C/T/L-spine on the same day negative for spinal disease.  She continued with monthly HD MTX and rituximab for at least three cycles.  MRI brain on 07/08/09 and 09/02/09 noted positive response to therapy without any definitive abnormal enhancement in the splenium of the corpus callosum.  Of note her, case was reviewed at the [facility] in 2010 for possible auto-transplant following induction therapy however patient did not follow up.        She subsequently moved back to [place], it is unclear if she had any further monitoring in the interim however was found to have relapse of her disease in 08/2013 after presenting to [facility] with 3 months of falls and headaches and confusion.  Imaging noted a right basal ganglia lesion.  She was transferred to [facility] where she underwent a total of six cycles of HD MTX (6g/m2 with first dose, 4 g/m2 with subsequent doses) with the last four cycles including rituximab.  Her hospital stay was reportedly complicated by confusoin and impulsivity.  MRI at the end of treatment on 11/01/2013 noted decrease in right basal ganglia lesion compared to prior studies suggestive of treatment response.  She was followed as an outpatient by Dr. Beverly Milch at [facility] and had surveilance brain MRI in 08/2015 which noted decreased total volume of enhancing right basal ganglia tissue and encephalomalacia in the tumor bed consistent and  resolution of prior punctate focus of left frontal lobe with response to therapy."     ROS: Complete review of systems performed, all negative including no headaches    Past Ocular History:  CNS lymphoma diagnosed 2010:              - Dr. Chancy Milroy at [place] Medications:     Past Oncology History:    Past Medical History:    No past medical history on file.    Medications:    Outpatient Medications Prior to Visit   Medication Sig Dispense Refill    Artificial Tear Ointment (REFRESH LACRI-LUBE) Ophthalmic Ointment Apply 0.5 inches to affected EYE(S) 2 times a day as needed (dryness). 7 g 0    Melatonin 3 MG Oral Tab Take 1 tablet (3 mg) by mouth at bedtime. 30 tablet 0    Naproxen 500 MG Oral Tab Take 1 tablet (500 mg) by mouth 2 times a day as needed (headache). 60 tablet 0    Ondansetron HCl (ZOFRAN) 8 MG Oral Tab Take 1 tablet (8 mg) by mouth every 8 hours as needed for nausea/vomiting. 30 tablet 0    OxyCODONE HCl 10 MG Oral Tab Take 10 mg by mouth every 6 hours as needed for pain.       No facility-administered medications prior to visit.        Allergies:    Review of patient's allergies indicates:  Allergies   Allergen Reactions    Bee Venom Shortness of Breath and Swelling       Family History:  No family history of eye disease  - History of skin cancers?  - History of eye cancers?     Social History:   Lives in [place], [place].   Works as: used to work as Network engineer, now unemployed  Tob:  11 pack year smoking history, current smoker  ETOH: heavy abuse years ago, currently not drinking  Recreational drug use: marijuana use weeks ago, methamphetamines months ago (snorted), denies IVDU      PHYSICAL EXAM   Eyes: See Eye Exam   Constitutional: Well-appearing  Psychiatric: Mood normal   Neurologic: Face is symmetric   Skin: No facial rashes  Head: Atraumatic   ENT: External ears and nose are normal in appearance .   Respiratory:normal respiratory effort     I personally examined this patient's confrontation visual fields (CVF) and extraocular movements (EOM) and I agree with the technicians's assessment.     MRI Brain (08/27/16)   IMPRESSION:  1. No acute intracranial abnormality or evidence of recurrent lymphoma.  2. Inferior right basal ganglia  encephalomalacia, which comes in close approximation to the right optic tract.  3. Moderate cerebral volume loss and confluent periventricular FLAIR signal hyperintensity which may represents accommodation of microvascular ischemic change and post treatment effect.  4. Fluid within the bilateral maxillary and sphenoid sinuses raises the possibility of acute sinusitis.    Syphilis: negative  ACE  Quant-Gold: negative  Toxoplasmosis PCR: IgG reactive, IgM equivocol  HIV: pending  Blood cultures: NGTD at 2 days    LP 08/29/16  Negative for malignancy    OCT 11/01/2016  OU: subretinal/subRPE deposits    Optos 11/01/2016  OD: diffuse subretinal fibrosis, no active lesions  OS: diffuse subretinal fibrosis, no active lesions    A/P:    1. Primary CNS lymphoma with ocular involvement  - Diagnosed with PCNSL in 2010, s/p systemic MTX  - Found to  have vitritis and chorioretinal lesions in 07/2016. Tap and inject done in the right eye out of concern for endophthalmitis. No growth from tap. LP and MRI at that time were normal and unchanged from previous. Visual acuity was LP OU.  Diagnosis made of ocular involvement of PCNSL and patient referred to rad onc for EBRT whole brain and orbits which was completed in 09/2016.   - Vision unchanged after EBRT. However, vitritis, haze and retinal lesions are all improved and regressed. No evidence of active tumor today. OCT of the maculas shows outer retinal atrophy with subretinal fibrosis. These are the cause of her continued decreased vision. I explained that due to these findings the visual prognosis is guarded in both eyes. As the intraocular lymphoma appears to be regressed, there is no additional treatment needed at this time.   - Return in 2-3 months for DFE, Optos, OCT-EDI macula OU.    Referring Doctor: Dr. Rocky Morel, MD  Ocular Oncology  Department of Ophthalmology  [place] [facility] [place]

## 2016-11-02 NOTE — Progress Notes (Signed)
Bilateral OCT scans of retina, and color fundus photos on Optos, performed in clinic.

## 2016-11-03 ENCOUNTER — Non-Acute Institutional Stay (SKILLED_NURSING_FACILITY): Payer: Medicare Other | Admitting: Geriatric Medicine

## 2016-11-03 DIAGNOSIS — H543 Unqualified visual loss, both eyes: Secondary | ICD-10-CM

## 2016-11-03 DIAGNOSIS — F5101 Primary insomnia: Secondary | ICD-10-CM

## 2016-11-03 DIAGNOSIS — C6961 Malignant neoplasm of right orbit: Secondary | ICD-10-CM

## 2016-11-03 DIAGNOSIS — F17200 Nicotine dependence, unspecified, uncomplicated: Secondary | ICD-10-CM

## 2016-11-03 DIAGNOSIS — G44229 Chronic tension-type headache, not intractable: Secondary | ICD-10-CM

## 2016-11-04 ENCOUNTER — Ambulatory Visit: Payer: Self-pay

## 2016-11-04 NOTE — Telephone Encounter (Signed)
Abeba calling from [employer].  She is needed a DEA number called into a pharmacy.  I paged PA Judeth Porch who answered promptly and connected to [place].
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
## 2016-11-10 ENCOUNTER — Encounter (HOSPITAL_COMMUNITY): Payer: Self-pay

## 2016-11-10 NOTE — Progress Notes (Signed)
Updated med list per Rad Onc [facility] med list

## 2017-01-02 ENCOUNTER — Encounter (HOSPITAL_BASED_OUTPATIENT_CLINIC_OR_DEPARTMENT_OTHER): Payer: Medicare Other | Admitting: Ophthalmology

## 2018-09-09 ENCOUNTER — Encounter (HOSPITAL_BASED_OUTPATIENT_CLINIC_OR_DEPARTMENT_OTHER): Payer: Self-pay | Admitting: Neurology

## 2022-07-07 ENCOUNTER — Encounter: Payer: Self-pay | Admitting: Adult Health

## 2022-07-07 ENCOUNTER — Ambulatory Visit (INDEPENDENT_AMBULATORY_CARE_PROVIDER_SITE_OTHER): Payer: 59 | Admitting: Adult Health

## 2022-07-07 VITALS — BP 98/62 | HR 71 | Temp 97.3°F | Resp 20 | Ht 62.0 in | Wt 117.0 lb

## 2022-07-07 DIAGNOSIS — M24562 Contracture, left knee: Secondary | ICD-10-CM | POA: Diagnosis not present

## 2022-07-07 DIAGNOSIS — Z7689 Persons encountering health services in other specified circumstances: Secondary | ICD-10-CM | POA: Diagnosis not present

## 2022-07-07 DIAGNOSIS — I951 Orthostatic hypotension: Secondary | ICD-10-CM

## 2022-07-07 DIAGNOSIS — H543 Unqualified visual loss, both eyes: Secondary | ICD-10-CM

## 2022-07-07 DIAGNOSIS — Z1231 Encounter for screening mammogram for malignant neoplasm of breast: Secondary | ICD-10-CM

## 2022-07-07 DIAGNOSIS — R1312 Dysphagia, oropharyngeal phase: Secondary | ICD-10-CM

## 2022-07-07 DIAGNOSIS — K219 Gastro-esophageal reflux disease without esophagitis: Secondary | ICD-10-CM

## 2022-07-07 DIAGNOSIS — C8589 Other specified types of non-Hodgkin lymphoma, extranodal and solid organ sites: Secondary | ICD-10-CM

## 2022-07-07 DIAGNOSIS — Z113 Encounter for screening for infections with a predominantly sexual mode of transmission: Secondary | ICD-10-CM

## 2022-07-07 MED ORDER — MIDODRINE HCL 2.5 MG PO TABS: 2.5000 mg | ORAL_TABLET | Freq: Every day | ORAL | 3 refills | Status: DC

## 2022-07-07 NOTE — Patient Instructions (Signed)
Preventive Care 65 Years and Older, Female Preventive care refers to lifestyle choices and visits with your health care provider that can promote health and wellness. Preventive care visits are also called wellness exams. What can I expect for my preventive care visit? Counseling Your health care provider may ask you questions about your: Medical history, including: Past medical problems. Family medical history. Pregnancy and menstrual history. History of falls. Current health, including: Memory and ability to understand (cognition). Emotional well-being. Home life and relationship well-being. Sexual activity and sexual health. Lifestyle, including: Alcohol, nicotine or tobacco, and drug use. Access to firearms. Diet, exercise, and sleep habits. Work and work environment. Sunscreen use. Safety issues such as seatbelt and bike helmet use. Physical exam Your health care provider will check your: Height and weight. These may be used to calculate your BMI (body mass index). BMI is a measurement that tells if you are at a healthy weight. Waist circumference. This measures the distance around your waistline. This measurement also tells if you are at a healthy weight and may help predict your risk of certain diseases, such as type 2 diabetes and high blood pressure. Heart rate and blood pressure. Body temperature. Skin for abnormal spots. What immunizations do I need?  Vaccines are usually given at various ages, according to a schedule. Your health care provider will recommend vaccines for you based on your age, medical history, and lifestyle or other factors, such as travel or where you work. What tests do I need? Screening Your health care provider may recommend screening tests for certain conditions. This may include: Lipid and cholesterol levels. Hepatitis C test. Hepatitis B test. HIV (human immunodeficiency virus) test. STI (sexually transmitted infection) testing, if you are at  risk. Lung cancer screening. Colorectal cancer screening. Diabetes screening. This is done by checking your blood sugar (glucose) after you have not eaten for a while (fasting). Mammogram. Talk with your health care provider about how often you should have regular mammograms. BRCA-related cancer screening. This may be done if you have a family history of breast, ovarian, tubal, or peritoneal cancers. Bone density scan. This is done to screen for osteoporosis. Talk with your health care provider about your test results, treatment options, and if necessary, the need for more tests. Follow these instructions at home: Eating and drinking  Eat a diet that includes fresh fruits and vegetables, whole grains, lean protein, and low-fat dairy products. Limit your intake of foods with high amounts of sugar, saturated fats, and salt. Take vitamin and mineral supplements as recommended by your health care provider. Do not drink alcohol if your health care provider tells you not to drink. If you drink alcohol: Limit how much you have to 0-1 drink a day. Know how much alcohol is in your drink. In the [place] one drink equals one 12 oz bottle of beer (355 mL), one 5 oz glass of wine (148 mL), or one 1 oz glass of hard liquor (44 mL). Lifestyle Brush your teeth every morning and night with fluoride toothpaste. Floss one time each day. Exercise for at least 30 minutes 5 or more days each week. Do not use any products that contain nicotine or tobacco. These products include cigarettes, chewing tobacco, and vaping devices, such as e-cigarettes. If you need help quitting, ask your health care provider. Do not use drugs. If you are sexually active, practice safe sex. Use a condom or other form of protection in order to prevent STIs. Take aspirin only as told by   your health care provider. Make sure that you understand how much to take and what form to take. Work with your health care provider to find out whether it  is safe and beneficial for you to take aspirin daily. Ask your health care provider if you need to take a cholesterol-lowering medicine (statin). Find healthy ways to manage stress, such as: Meditation, yoga, or listening to music. Journaling. Talking to a trusted person. Spending time with friends and family. Minimize exposure to UV radiation to reduce your risk of skin cancer. Safety Always wear your seat belt while driving or riding in a vehicle. Do not drive: If you have been drinking alcohol. Do not ride with someone who has been drinking. When you are tired or distracted. While texting. If you have been using any mind-altering substances or drugs. Wear a helmet and other protective equipment during sports activities. If you have firearms in your house, make sure you follow all gun safety procedures. What's next? Visit your health care provider once a year for an annual wellness visit. Ask your health care provider how often you should have your eyes and teeth checked. Stay up to date on all vaccines. This information is not intended to replace advice given to you by your health care provider. Make sure you discuss any questions you have with your health care provider. Document Revised: 11/17/2020 Document Reviewed: 11/17/2020 Elsevier Patient Education  2023 Elsevier Inc.  

## 2022-07-07 NOTE — Progress Notes (Unsigned)
[facility] clinic  Provider:  Durenda Age DNP  Code Status:  Full Code  Goals of Care:     01/22/2014    3:52 PM  Advanced Directives  Does Patient Have a Medical Advance Directive? No  Would patient like information on creating a medical advance directive? No - patient declined information     Chief Complaint  Patient presents with   New Patient (Initial Visit)    Patient presents today for a new patient appointment     HPI: Patient is a 67 y.o. female seen today to establish care with [place].  Past Medical History:  Diagnosis Date   Brain cancer ([place])    Non-Hodgkin's lymphoma ([place])     Past Surgical History:  Procedure Laterality Date   BRAIN BIOPSY     CATARACT EXTRACTION     CHOLECYSTECTOMY     TONSILLECTOMY     TUBAL LIGATION      Allergies  Allergen Reactions   Bee Venom Swelling   Tape Other (See Comments) and Rash    Makes her arm red. Prefers paper tape    Outpatient Encounter Medications as of 07/07/2022  Medication Sig   acetaminophen (TYLENOL) 500 MG tablet Take 500 mg by mouth every 6 (six) hours as needed.   cetirizine (ZYRTEC) 10 MG chewable tablet Chew 1 tablet by mouth daily.   cyanocobalamin (VITAMIN B12) 1000 MCG tablet Take 3 tablets by mouth daily.   famotidine (PEPCID) 20 MG tablet Take 20 mg by mouth 2 (two) times daily.   Melatonin 10 MG TABS Take 1 tablet by mouth at bedtime.   mirtazapine (REMERON) 15 MG tablet Take 15 mg by mouth at bedtime.   ondansetron (ZOFRAN-ODT) 8 MG disintegrating tablet Take 8 mg by mouth daily as needed.   senna-docusate (SENOKOT-S) 8.6-50 MG tablet Take 1 tablet by mouth 2 (two) times daily.   citalopram (CELEXA) 10 MG tablet Take 10 mg by mouth daily. (Patient not taking: Reported on 07/07/2022)   HYDROcodone-acetaminophen (NORCO) 5-325 MG per tablet Take 1-2 tablets by mouth every 4 (four) hours as needed for severe pain. (Patient not taking: Reported on 07/07/2022)   LORazepam (ATIVAN) 1 MG tablet Take 1  mg by mouth every 8 (eight) hours. (Patient not taking: Reported on 07/07/2022)   No facility-administered encounter medications on file as of 07/07/2022.    Review of Systems:  Review of Systems  Health Maintenance  Topic Date Due   Medicare Annual Wellness (AWV)  Never done   COVID-19 Vaccine (1) Never done   Pneumonia Vaccine 47+ Years old (1 - PCV) Never done   Hepatitis C Screening  Never done   DTaP/Tdap/Td (1 - Tdap) Never done   Zoster Vaccines- Shingrix (1 of 2) Never done   COLONOSCOPY (Pts 45-40yr Insurance coverage will need to be confirmed)  Never done   Lung Cancer Screening  Never done   MAMMOGRAM  Never done   DEXA SCAN  Never done   INFLUENZA VACCINE  Never done   HPV VACCINES  Aged Out    Physical Exam: Vitals:   07/07/22 1038  BP: 98/62  Pulse: 71  Resp: 20  Temp: (!) 97.3 F (36.3 C)  SpO2: 99%  Weight: 117 lb (53.1 kg)  Height: '5\' 2"'$  (1.575 m)   Body mass index is 21.4 kg/m. Physical Exam Constitutional:      General: She is not in acute distress.    Appearance: Normal appearance.  HENT:  Head: Normocephalic and atraumatic.     Nose: Nose normal.     Mouth/Throat:     Mouth: Mucous membranes are moist.  Eyes:     Conjunctiva/sclera: Conjunctivae normal.  Cardiovascular:     Rate and Rhythm: Normal rate and regular rhythm.     Comments: Porta cath on right chest Pulmonary:     Effort: Pulmonary effort is normal.     Breath sounds: Normal breath sounds.  Abdominal:     General: Bowel sounds are normal.     Palpations: Abdomen is soft.  Musculoskeletal:        General: Normal range of motion.     Cervical back: Normal range of motion.  Skin:    General: Skin is warm and dry.     Comments: Fatty tissues on left chest near axilla  Neurological:     General: No focal deficit present.     Mental Status: She is alert and oriented to person, place, and time.  Psychiatric:        Mood and Affect: Mood normal.        Behavior: Behavior  normal.        Thought Content: Thought content normal.        Judgment: Judgment normal.     Labs reviewed: Basic Metabolic Panel: No results for input(s): "NA", "K", "CL", "CO2", "GLUCOSE", "BUN", "CREATININE", "CALCIUM", "MG", "PHOS", "TSH" in the last 8760 hours. Liver Function Tests: No results for input(s): "AST", "ALT", "ALKPHOS", "BILITOT", "PROT", "ALBUMIN" in the last 8760 hours. No results for input(s): "LIPASE", "AMYLASE" in the last 8760 hours. No results for input(s): "AMMONIA" in the last 8760 hours. CBC: No results for input(s): "WBC", "NEUTROABS", "HGB", "HCT", "MCV", "PLT" in the last 8760 hours. Lipid Panel: No results for input(s): "CHOL", "HDL", "LDLCALC", "TRIG", "CHOLHDL", "LDLDIRECT" in the last 8760 hours. No results found for: "HGBA1C"  Procedures since last visit: No results found.  Assessment/Plan    Labs/tests ordered:  * No order type specified *  Next appt:  Visit date not found

## 2022-07-10 ENCOUNTER — Other Ambulatory Visit: Payer: 59

## 2022-07-16 ENCOUNTER — Other Ambulatory Visit: Payer: Self-pay | Admitting: Adult Health

## 2022-07-16 DIAGNOSIS — I951 Orthostatic hypotension: Secondary | ICD-10-CM

## 2022-07-24 ENCOUNTER — Telehealth: Payer: Self-pay

## 2022-07-24 ENCOUNTER — Ambulatory Visit: Payer: 59 | Admitting: Adult Health

## 2022-07-24 NOTE — Telephone Encounter (Signed)
Incoming call received from [facility] with [facility] stating they faxed a plan of care on patient last week and would like to know if we received and questions when it will be reviewed, signed, and returned.  I placed Levada Dy on a brief hold, checked Medina-Vargas, Monina C, NP mailbox and spoke with her and Jaymes Graff has not seen a plan of care for [place].   Call resumed with Levada Dy and I asked that she have document re-faxed to our office. Fax number confirmed.

## 2022-07-27 ENCOUNTER — Telehealth: Payer: Self-pay | Admitting: *Deleted

## 2022-07-27 NOTE — Telephone Encounter (Signed)
Krystal Stevens with [facility] called requesting Verbal orders for ST to evaluate.   Verbal Orders given.

## 2022-08-04 ENCOUNTER — Telehealth: Payer: Self-pay

## 2022-08-04 NOTE — Telephone Encounter (Signed)
Asa Saunas from [facility] is calling for verbal orders. She does speech therapy and is requesting patient has speech 1 time a week for 6 weeks. Verbal order given.

## 2022-08-11 ENCOUNTER — Other Ambulatory Visit: Payer: Self-pay | Admitting: Adult Health

## 2022-08-11 ENCOUNTER — Telehealth: Payer: Self-pay | Admitting: *Deleted

## 2022-08-11 DIAGNOSIS — R1312 Dysphagia, oropharyngeal phase: Secondary | ICD-10-CM

## 2022-08-11 NOTE — Telephone Encounter (Signed)
Medina-Vargas, Monina C, NP  You9 minutes ago (3:43 PM)    I will send request for barium swallow.

## 2022-08-11 NOTE — Telephone Encounter (Signed)
Gene, ST with [facility], called and stated that patient had a Modified Barium Swallow about 6 month ago but the family thinks it was not a good study.   They are requesting another Modified Barium Swallow to be done. Constantly coughing and choking. Family has to suction several times a day to keep patient from choking.   Requesting order to be placed.

## 2022-08-14 NOTE — Addendum Note (Signed)
Addended by: Durenda Age C on: 08/14/2022 12:10 PM   Modules accepted: Orders

## 2022-08-15 ENCOUNTER — Other Ambulatory Visit (HOSPITAL_COMMUNITY): Payer: Self-pay

## 2022-08-15 DIAGNOSIS — R1310 Dysphagia, unspecified: Secondary | ICD-10-CM

## 2022-08-15 DIAGNOSIS — R059 Cough, unspecified: Secondary | ICD-10-CM

## 2022-08-24 ENCOUNTER — Ambulatory Visit (HOSPITAL_COMMUNITY)
Admission: RE | Admit: 2022-08-24 | Discharge: 2022-08-24 | Disposition: A | Discharge status: Home / Self Care | Payer: 59 | Source: Ambulatory Visit | Attending: Adult Health | Admitting: Adult Health

## 2022-08-24 DIAGNOSIS — R059 Cough, unspecified: Secondary | ICD-10-CM

## 2022-08-24 DIAGNOSIS — R1312 Dysphagia, oropharyngeal phase: Secondary | ICD-10-CM | POA: Diagnosis present

## 2022-08-24 DIAGNOSIS — R4189 Other symptoms and signs involving cognitive functions and awareness: Secondary | ICD-10-CM | POA: Insufficient documentation

## 2022-08-24 DIAGNOSIS — Z923 Personal history of irradiation: Secondary | ICD-10-CM | POA: Diagnosis not present

## 2022-08-24 DIAGNOSIS — R1310 Dysphagia, unspecified: Secondary | ICD-10-CM

## 2022-08-24 NOTE — Progress Notes (Signed)
Modified Barium Swallow Study  Patient Details  Name: Dashawn Karpinski MRN: [id number] Date of Birth: 1956/04/06  Today's Date: 08/24/2022  Modified Barium Swallow completed.  Full report located under Chart Review in the Imaging Section.  History of Present Illness Pt is a 67 yo female presenting for an OP MBS after being referred by [facility] SLP for concerns of coughing. She had an MBS 3/7 at [facility]; however, the impressions are unable to be accessed. Per chart, pt has a history of oropharyngeal dysphagia and is on a diet of purees and nectar thick liquids. Her daughter and son-in-law report pt had PNA 01/2022. PMH includes brain cancer, non-hodgkin's lymphoma (in remission), blindness (s/p radiation 2018), GERD, hypotension   Clinical Impression Pt has an oropharyngeal dysphagia characterized by impaired cognition and sensation. Due to pt's significant oral holding, fluoro had to be turned on and off frequently to consider exposure. This and pt's posture with shadow from shoulder limited view. She demonstrated functional pharyngeal physiology when she elicited a swallow, but airway protection was impacted moreso by length of oral holding and pharyngeal delay. As barium spilled into the pharynx and pooled in the pyriforms, there were moments of airway invasion. This occurred sometimes with the initial bolus and other times with oral residue. There was one clear moment of aspiration (PAS 8) and additional moments of penetration (PAS 2 and PAS 5) with concern for additional penetation and/or aspiration that were not able to be directly viewed. There was no clear benefit between consistencies tested. She did seem to elicit a swallow more with larger boluses, but she couldn't clear these larger boluses completely from her mouth. Pt is likely to be at risk for penetration and aspiration with PO intake, but also with secretions. In order to mitigate risk as much as possible, would follow strategies  below:  -Perform frequent, thorough oral care, including before meals with use of suction to clear secretions -Use larger boluses that elicit a swallow faster, but clear oral cavity in between bites/sips (may need to use suction) -Use any textures/flavors/temperatures that appear to elicit a swallow more quickly -Stay upright and as mobile as possible -Can try to cue her to swallow and/or cough intermittently as cognition allows\ -Offer food/drink when pt is optimally alert and more likely to be swallowing - can always take a break if she is not triggering a swallow at that time  Factors that may increase risk of adverse event in presence of aspiration ([place] 2021): Poor general health and/or compromised immunity;Reduced cognitive function;Limited mobility;Frail or deconditioned;Dependence for feeding and/or oral hygiene;Aspiration of thick, dense, and/or acidic materials  Swallow Evaluation Recommendations Recommendations: PO diet PO Diet Recommendation: Dysphagia 1 (Pureed);Thin liquids (Level 0) Liquid Administration via: Spoon;Cup;Straw Medication Administration: Crushed with puree Supervision: Full supervision/cueing for swallowing strategies;Full assist for feeding Swallowing strategies  : Slow rate;Small bites/sips;Check for anterior loss;Check for pocketing or oral holding Postural changes: Stay upright 30-60 min after meals;Position pt fully upright for meals Oral care recommendations: Use suctioning for oral care;Oral care BID (2x/day);Oral care before PO Caregiver Recommendations: Have oral suction available     Fabio Asa., Student SLP  08/24/2022,3:53 PM

## 2022-08-25 ENCOUNTER — Ambulatory Visit (INDEPENDENT_AMBULATORY_CARE_PROVIDER_SITE_OTHER): Payer: 59 | Admitting: Adult Health

## 2022-08-25 ENCOUNTER — Encounter: Payer: Self-pay | Admitting: Adult Health

## 2022-08-25 VITALS — BP 100/68 | HR 61 | Temp 97.2°F | Resp 16 | Ht 62.0 in | Wt 117.0 lb

## 2022-08-25 DIAGNOSIS — C8589 Other specified types of non-Hodgkin lymphoma, extranodal and solid organ sites: Secondary | ICD-10-CM

## 2022-08-25 DIAGNOSIS — M24562 Contracture, left knee: Secondary | ICD-10-CM

## 2022-08-25 DIAGNOSIS — K219 Gastro-esophageal reflux disease without esophagitis: Secondary | ICD-10-CM | POA: Diagnosis not present

## 2022-08-25 DIAGNOSIS — R110 Nausea: Secondary | ICD-10-CM

## 2022-08-25 DIAGNOSIS — R1312 Dysphagia, oropharyngeal phase: Secondary | ICD-10-CM

## 2022-08-25 DIAGNOSIS — Z113 Encounter for screening for infections with a predominantly sexual mode of transmission: Secondary | ICD-10-CM

## 2022-08-25 DIAGNOSIS — I951 Orthostatic hypotension: Secondary | ICD-10-CM

## 2022-08-25 MED ORDER — ONDANSETRON 8 MG PO TBDP: 8.0000 mg | ORAL_TABLET | Freq: Every day | ORAL | 3 refills | Status: DC | PRN

## 2022-08-25 MED ORDER — FAMOTIDINE 20 MG PO TABS: 20.0000 mg | ORAL_TABLET | Freq: Two times a day (BID) | ORAL | 3 refills | Status: DC

## 2022-08-25 MED ORDER — MIDODRINE HCL 2.5 MG PO TABS: 2.5000 mg | ORAL_TABLET | Freq: Two times a day (BID) | ORAL | 1 refills | Status: DC

## 2022-08-25 NOTE — Addendum Note (Signed)
Addended by: Durenda Age C on: 08/25/2022 11:28 AM   Modules accepted: Orders

## 2022-08-25 NOTE — Progress Notes (Signed)
[facility]  Provider: Durenda Age DNP  Code Status:  Full Code  Goals of Care:     01/22/2014    3:52 PM  Advanced Directives  Does Patient Have a Medical Advance Directive? No  Would patient like information on creating a medical advance directive? No - patient declined information     Chief Complaint  Patient presents with   Follow-up    Follow- up    HPI: Patient is a 67 y.o. female seen today for medical management of chronic issues. She was accompanied today by her daughter. She has her eyes closed and opens them when requested during the office visit.   Oropharyngeal dysphagia -  eats pureed with thin liquids  CNS lymphoma ([place]) -  follows up at [facility]  Contracture of left knee - Plan: Lipid panel, Hemoglobin A1C  Gastroesophageal reflux disease without esophagitis - Plan: Lipid panel, Hemoglobin A1C, famotidine (PEPCID) 20 MG tablet  Orthostatic hypotension - Plan: midodrine (PROAMATINE) 2.5 MG tablet  Nausea - Plan: ondansetron (ZOFRAN-ODT) 8 MG disintegrating tablet  Screening for STD (sexually transmitted disease) - Plan: Hepatitis C Antibody    Past Medical History:  Diagnosis Date   Admission for antineoplastic chemotherapy    Brain cancer ([place])    Chronic pulmonary aspiration    Cluster headaches    Coronary artery disease due to calcified coronary lesion    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[place])    Deviated nasal septum    Dyslipidemia    Emphysema of lung (HCC)    Eustachian tube dysfunction, bilateral    Fibromyalgia    Hypertrophy of inferior nasal turbinate    Mixed conductive and sensorineural hearing loss of right ear with restricted hearing of left ear    Mucoid otitis media of both ears with effusion    Neoplasm of brain, malignant (HCC)    Non-Hodgkin's lymphoma ([place])    Nuclear sclerosis of both eyes    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 Prefers paper tape    Outpatient Encounter Medications as of 08/25/2022  Medication Sig   acetaminophen (TYLENOL) 500 MG tablet Take 500 mg by mouth every 6 (six) hours as needed.   cetirizine (ZYRTEC) 10 MG chewable tablet Chew 1 tablet by mouth daily.   cyanocobalamin (VITAMIN B12) 1000 MCG tablet Take 3 tablets by mouth daily.   famotidine (PEPCID) 20 MG tablet Take 20 mg by mouth 2 (two) times daily.   Melatonin 10 MG TABS Take 1 tablet by mouth at bedtime.   midodrine (PROAMATINE) 2.5 MG tablet TAKE 1 TABLET BY MOUTH EVERY DAY   mirtazapine (REMERON) 15 MG tablet Take 15 mg by mouth at bedtime.   ondansetron (ZOFRAN-ODT) 8 MG disintegrating tablet Take 8 mg by mouth  daily as needed.   senna-docusate (SENOKOT-S) 8.6-50 MG tablet Take 1 tablet by mouth 2 (two) times daily.   No facility-administered encounter medications on file as of 08/25/2022.    Review of Systems:  Review of Systems  Constitutional:  Negative for appetite change, chills, fatigue and fever.  HENT:  Negative for congestion, hearing loss, rhinorrhea and sore throat.   Eyes: Negative.   Respiratory:  Negative for cough, shortness of breath and wheezing.   Cardiovascular:  Negative for chest pain, palpitations and leg swelling.  Gastrointestinal:  Negative for abdominal pain, constipation, diarrhea, nausea and vomiting.  Genitourinary:  Negative for dysuria.  Musculoskeletal:  Negative for arthralgias, back pain and  myalgias.  Skin:  Negative for color change, rash and wound.  Neurological:  Negative for dizziness, weakness and headaches.  Psychiatric/Behavioral:  Negative for behavioral problems. The patient is not nervous/anxious.     Health Maintenance  Topic Date Due   Medicare Annual Wellness (AWV)  Never done   Hepatitis C Screening  Never done   COLONOSCOPY (Pts 45-46yrs Insurance coverage will need to be confirmed)  Never done   Lung Cancer Screening  Never done   MAMMOGRAM  Never done   DEXA SCAN  Never done   INFLUENZA VACCINE  09/03/2022 (Originally 01/03/2022)   COVID-19 Vaccine (1) 09/10/2022 (Originally 07/06/1960)   Zoster Vaccines- Shingrix (1 of 2) 11/25/2022 (Originally 07/05/1973)   Pneumonia Vaccine 62+ Years old (1 of 1 - PCV) 08/25/2023 (Originally 07/06/2019)   HPV VACCINES  Aged Out   DTaP/Tdap/Td  Discontinued    Physical Exam: Vitals:   08/25/22 1056  BP: 100/68  Pulse: 61  Resp: 16  Temp: (!) 97.2 F (36.2 C)  SpO2: 99%  Weight: 117 lb (53.1 kg)  Height: 5\' 2"  (1.575 m)   Body mass index is 21.4 kg/m. Physical Exam HENT:     Head: Normocephalic and atraumatic.     Nose: Nose normal.     Mouth/Throat:     Mouth: Mucous membranes are moist.  Eyes:     Conjunctiva/sclera: Conjunctivae normal.  Cardiovascular:     Rate and Rhythm: Normal rate and regular rhythm.  Pulmonary:     Effort: Pulmonary effort is normal.     Breath sounds: Normal breath sounds.  Abdominal:     General: Bowel sounds are normal.     Palpations: Abdomen is soft.  Musculoskeletal:     Cervical back: Normal range of motion.     Comments: Left knee contacted  Skin:    General: Skin is warm and dry.  Neurological:     Mental Status: She is alert.     Comments: Sleepy  Psychiatric:        Mood and Affect: Mood normal.        Behavior: Behavior normal.        Thought Content: Thought content normal.        Judgment: Judgment normal.     Labs reviewed: Basic Metabolic  Panel: No results for input(s): "NA", "K", "CL", "CO2", "GLUCOSE", "BUN", "CREATININE", "CALCIUM", "MG", "PHOS", "TSH" in the last 8760 hours. Liver Function Tests: No results for input(s): "AST", "ALT", "ALKPHOS", "BILITOT", "PROT", "ALBUMIN" in the last 8760 hours. No results for input(s): "LIPASE", "AMYLASE" in the last 8760 hours. No results for input(s): "AMMONIA" in the last 8760 hours. CBC: No results for input(s): "WBC", "NEUTROABS", "HGB", "HCT", "MCV", "PLT" in the last 8760 hours. Lipid Panel: No results for input(s): "CHOL", "HDL", "LDLCALC", "TRIG", "  CHOLHDL", "LDLDIRECT" in the last 8760 hours. No results found for: "HGBA1C"  Procedures since last visit: DG SWALLOW FUNC OP MEDICARE SPEECH PATH  Result Date: 08/24/2022 Table formatting from the original result was not included. Modified Barium Swallow Study Patient Details Name: Krystal Stevens MRN: [id number] Date of Birth: [birth year] Today's Date: 08/24/2022 HPI/PMH: HPI: Pt is a 67 yo female presenting for an OP MBS after being referred by [facility] SLP for concerns of coughing. She had an MBS 3/7 at [facility]; however, the impressions are unable to be accessed. Per chart, pt has a history of oropharyngeal dysphagia and is on a diet of purees and nectar thick liquids. Her daughter and son-in-law report pt had PNA 01/2022. PMH includes brain cancer, non-hodgkin's lymphoma (in remission), blindness (s/p radiation 2018), GERD, hypotension Clinical Impression: Pt has an oropharyngeal dysphagia characterized by impaired cognition and sensation. Due to pt's significant oral holding, fluoro had to be turned on and off frequently to consider exposure. This and pt's posture with shadow from shoulder limited view. She demonstrated functional pharyngeal physiology when she elicited a swallow, but airway protection was impacted moreso by length of oral holding and pharyngeal delay. As barium spilled into the pharynx and pooled in the pyriforms, there were  moments of airway invasion. This occurred sometimes with the initial bolus and other times with oral residue. There was one clear moment of aspiration (PAS 8) and additional moments of penetration (PAS 2 and PAS 5) with concern for additional penetation and/or aspiration that were not able to be directly viewed. There was no clear benefit between consistencies tested. She did seem to elicit a swallow more with larger boluses, but she couldn't clear these larger boluses completely from her mouth. Pt is likely to be at risk for penetration and aspiration with PO intake, but also with secretions. In order to mitigate risk as much as possible, would follow strategies below:  -Perform frequent, thorough oral care, including before meals with use of suction to clear secretions -Use larger boluses that elicit a swallow faster, but clear oral cavity in between bites/sips (may need to use suction) -Use any textures/flavors/temperatures that appear to elicit a swallow more quickly -Stay upright and as mobile as possible -Can try to cue her to swallow and/or cough intermittently as cognition allows\ -Offer food/drink when pt is optimally alert and more likely to be swallowing - can always take a break if she is not triggering a swallow at that time Factors that may increase risk of adverse event in presence of aspiration ([place] 2021): Factors that may increase risk of adverse event in presence of aspiration ([place] 2021): Poor general health and/or compromised immunity; Reduced cognitive function; Limited mobility; Frail or deconditioned; Dependence for feeding and/or oral hygiene; Aspiration of thick, dense, and/or acidic materials Recommendations/Plan: Swallowing Evaluation Recommendations Swallowing Evaluation Recommendations Recommendations: PO diet PO Diet Recommendation: Dysphagia 1 (Pureed); Thin liquids (Level 0) Liquid Administration via: Spoon; Cup; Straw Medication Administration: Crushed with  puree Supervision: Full supervision/cueing for swallowing strategies; Full assist for feeding Swallowing strategies  : Slow rate; Small bites/sips; Check for anterior loss; Check for pocketing or oral holding Postural changes: Stay upright 30-60 min after meals; Position pt fully upright for meals Oral care recommendations: Use suctioning for oral care; Oral care BID (2x/day); Oral care before PO Caregiver Recommendations: Have oral suction available Treatment Plan Treatment Plan Treatment recommendations: Defer treatment plan to SLP at other venue (see follow-up recommendations) Follow-up recommendations: Home health SLP  Functional status assessment: Patient has had a recent decline in their functional status and/or demonstrates limited ability to make significant improvements in function in a reasonable and predictable amount of time. Recommendations Recommendations for follow up therapy are one component of a multi-disciplinary discharge planning process, led by the attending physician.  Recommendations may be updated based on patient status, additional functional criteria and insurance authorization. Assessment: Orofacial Exam: Orofacial Exam Oral Cavity: Oral Hygiene: Pooled secretions Oral Cavity - Dentition: Edentulous; Missing dentition; Poor condition Orofacial Anatomy: WFL Oral Motor/Sensory Function: Unable to test Anatomy: Anatomy: WFL Thin Liquids: Thin Liquids (Level 0) Thin Liquids : Impaired Bolus delivery method: Cup Thin Liquid - Impairment: Oral Impairment; Pharyngeal impairment Lip Closure: Escape beyond mid-chin Tongue control during bolus hold: Cohesive bolus between tongue to palatal seal Bolus transport/lingual motion: Delayed initiation of tongue motion (oral holding); Repetitive/disorganized tongue motion Oral residue: Majority of bolus remaining Location of oral residue : Tongue Initiation of swallow : Pyriform sinuses Soft palate elevation: No bolus between soft palate (SP)/pharyngeal  wall (PW) Laryngeal elevation: Complete superior movement of thyroid cartilage with complete approximation of arytenoids to epiglottic petiole Anterior hyoid excursion: Complete Epiglottic movement: Complete Laryngeal vestibule closure: Incomplete, narrow column air/contrast in laryngeal vestibule Pharyngeal stripping wave : Present - complete Pharyngeal contraction (A/P view only): N/A Pharyngoesophageal segment opening: Complete distension and complete duration, no obstruction of flow Tongue base retraction: Wide column of contrast or air between tongue base and PPW Pharyngeal residue: Collection of residue within or on pharyngeal structures Location of pharyngeal residue: Tongue base; Valleculae; Pyriform sinuses Penetration/Aspiration Scale (PAS) score: 8.  Material enters airway, passes BELOW cords without attempt by patient to eject out (silent aspiration)  Mildly Thick Liquids: Mildly thick liquids (Level 2, nectar thick) Mildly thick liquids (Level 2, nectar thick): Impaired Bolus delivery method: Cup Mildly Thick Liquid - Impairment: Oral Impairment; Pharyngeal impairment Lip Closure: Interlabial escape, no progression to anterior lip Tongue control during bolus hold: Not tested Bolus transport/lingual motion: Delayed initiation of tongue motion (oral holding) Oral residue: Trace residue lining oral structures Location of oral residue : Tongue Initiation of swallow : Pyriform sinuses Soft palate elevation: No bolus between soft palate (SP)/pharyngeal wall (PW) Laryngeal elevation: Complete superior movement of thyroid cartilage with complete approximation of arytenoids to epiglottic petiole Anterior hyoid excursion: Complete Epiglottic movement: Complete Laryngeal vestibule closure: Incomplete, narrow column air/contrast in laryngeal vestibule Pharyngeal stripping wave : Present - complete Pharyngeal contraction (A/P view only): N/A Pharyngoesophageal segment opening: Complete distension and complete  duration, no obstruction of flow Tongue base retraction: Wide column of contrast or air between tongue base and PPW Pharyngeal residue: Collection of residue within or on pharyngeal structures Location of pharyngeal residue: Valleculae; Tongue base Penetration/Aspiration Scale (PAS) score: 5.  Material enters airway, CONTACTS cords and not ejected out  Moderately Thick Liquids: No data recorded Puree: Puree Puree: Impaired Puree - Impairment: Oral Impairment; Pharyngeal impairment Lip Closure: Interlabial escape, no progression to anterior lip Bolus transport/lingual motion: Delayed initiation of tongue motion (oral holding) Oral residue: Trace residue lining oral structures Location of oral residue : Tongue; Palate Initiation of swallow: Pyriform sinuses Soft palate elevation: No bolus between soft palate (SP)/pharyngeal wall (PW) Laryngeal elevation: Complete superior movement of thyroid cartilage with complete approximation of arytenoids to epiglottic petiole Anterior hyoid excursion: Complete Epiglottic movement: Complete Pharyngeal contraction (A/P view only): N/A Pharyngoesophageal segment opening: Complete distension and complete duration, no obstruction of flow Tongue base retraction: Trace column of contrast  or air between tongue base and PPW Pharyngeal residue: Collection of residue within or on pharyngeal structures Location of pharyngeal residue: Valleculae Penetration/Aspiration Scale (PAS) score: 5.  Material enters airway, CONTACTS cords and not ejected out Solid: No data recorded Pill: No data recorded Compensatory Strategies: Compensatory Strategies Compensatory strategies: Yes Straw: Effective Effective Straw: Thin liquid (Level 0)   General Information: Caregiver present: Yes  Diet Prior to this Study: Dysphagia 1 (pureed); Mildly thick liquids (Level 2, nectar thick); Thin liquids (Level 0)   Temperature : Normal   Respiratory Status: WFL   Supplemental O2: None (Room air)   History of Recent  Intubation: No  Behavior/Cognition: Alert; Requires cueing; Cooperative Self-Feeding Abilities: Dependent for feeding Baseline vocal quality/speech: Hypophonia/low volume Volitional Cough: Unable to elicit Volitional Swallow: Unable to elicit Exam Limitations: Poor positioning Goal Planning: Prognosis for improved oropharyngeal function: Fair Barriers to Reach Goals: Cognitive deficits; Time post onset No data recorded Patient/Family Stated Goal: none stated Consulted and agree with results and recommendations: Patient; Family member/caregiver Pain: Pain Assessment Pain Assessment: Faces Faces Pain Scale: 0 End of Session: Start Time:SLP Start Time (ACUTE ONLY): 1115 Stop Time: SLP Stop Time (ACUTE ONLY): 1209 Time Calculation:SLP Time Calculation (min) (ACUTE ONLY): 54 min Charges: SLP Evaluations $ SLP Speech Visit: 1 Visit SLP Evaluations $Outpatient MBS Swallow: 1 Procedure SLP visit diagnosis: SLP Visit Diagnosis: Dysphagia, oropharyngeal phase (R13.12) Past Medical History: Past Medical History: Diagnosis Date  Admission for antineoplastic chemotherapy   Brain cancer ([place])   Chronic pulmonary aspiration   Cluster headaches   Coronary artery disease due to calcified coronary lesion   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[place])   Deviated nasal septum   Dyslipidemia   Emphysema of lung (HCC)   Eustachian tube dysfunction, bilateral   Fibromyalgia   Hypertrophy of inferior nasal turbinate   Mixed conductive and sensorineural hearing loss of right ear with restricted hearing of left ear   Mucoid otitis media of both ears with effusion   Neoplasm of brain, malignant (HCC)   Non-Hodgkin's lymphoma ([place])   Nuclear sclerosis of both eyes   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 [facility] [phone] Secure chat preferred 08/24/2022, 4:02 PM CLINICAL DATA:  Patient with history of brain cancer status post radiation chemotherapy treatments family reports she holds liquids and solids for long periods of time before swallowing she coughs intermittently with all textures and consistencies. Family has concern about large amounts of thick mucus during eating and drinking. EXAM: MODIFIED BARIUM SWALLOW TECHNIQUE: Different consistencies of barium were administered orally to the patient by the Speech Pathologist. Imaging of the pharynx was performed in the lateral projection. Reatha Armour, PA-C was present in the fluoroscopy room during this study, which was supervised and interpreted by Dr Sherryl Barters. FLUOROSCOPY: Radiation Exposure Index (as provided by the fluoroscopic device): 23.48 mGy [place] COMPARISON:  None Available. FINDINGS: Vestibular Penetration: Penetration visualized with thin, nectar, puree  consistencies. Aspiration: Aspiration visualized with thin, nectar, purees consistencies. Other: Delayed swallowing. Vallecular and piriform stasis and residue. IMPRESSION: Vestibular penetration and aspiration visualized with all tested consistencies today. Of note, there are significant delays in patient swallowing, along with follicular and para form stasis and residue. Please refer to the Speech Pathologists report for complete details and recommendations. Electronically Signed   By: Van Clines M.D.   On: 08/24/2022 12:44   Assessment/Plan  1. Oropharyngeal dysphagia -  continue pureed diet with thin liquids -  aspiration precautions  2. CNS lymphoma ([place]) -  follows up with  [facility] -  has porta cath  3. Contracture of left knee -  continue PT once a week -  fall precautions - Lipid panel - Hemoglobin A1C  4. Gastroesophageal reflux disease without esophagitis - Lipid panel - Hemoglobin A1C - famotidine (PEPCID) 20 MG tablet; Take 1 tablet (20 mg total) by mouth 2 (two) times daily.  Dispense: 60 tablet; Refill: 3  5. Orthostatic hypotension -  BP 110/88 - midodrine (PROAMATINE) 2.5 MG tablet; Take 1 tablet (2.5 mg total) by mouth 2 (two) times daily with a meal.  Dispense: 60 tablet; Refill: 1  6. Nausea - ondansetron (ZOFRAN-ODT) 8 MG disintegrating tablet; Take 1 tablet (8 mg total) by mouth daily as needed.  Dispense: 30 tablet; Refill: 3  7. Screening for STD (sexually transmitted disease) - Hepatitis C Antibody   Labs/tests ordered:  hep C antibody, Lipid panel and A1C  Next appt:  Visit date not found

## 2022-08-28 LAB — HEPATITIS C ANTIBODY: Hepatitis C Ab: NONREACTIVE

## 2022-08-28 LAB — LIPID PANEL
Cholesterol: 168 mg/dL (ref <200)
HDL: 46 mg/dL — ABNORMAL LOW (ref >=50)
LDL Cholesterol (Calc): 97 mg/dL (calc)
Non-HDL Cholesterol (Calc): 122 mg/dL (calc) (ref <130)
Total CHOL/HDL Ratio: 3.7 (calc) (ref <5.0)
Triglycerides: 158 mg/dL — ABNORMAL HIGH (ref <150)

## 2022-08-28 LAB — HEMOGLOBIN A1C
Hgb A1c MFr Bld: 5.4 % of total Hgb (ref <5.7)
Mean Plasma Glucose: 108 mg/dL
eAG (mmol/L): 6 mmol/L

## 2022-09-03 NOTE — Progress Notes (Signed)
-    A1C 5.4, normal range, not diabetic -  Hep C is negative -  triglycerides 158, slightly elevated (normal <150) -  cholesterol and LDL normal

## 2022-09-06 ENCOUNTER — Other Ambulatory Visit: Payer: Self-pay | Admitting: Adult Health

## 2022-09-06 DIAGNOSIS — N6320 Unspecified lump in the left breast, unspecified quadrant: Secondary | ICD-10-CM

## 2022-09-11 ENCOUNTER — Ambulatory Visit: Payer: Medicare Other

## 2022-09-12 ENCOUNTER — Other Ambulatory Visit: Payer: Self-pay | Admitting: Adult Health

## 2022-09-12 ENCOUNTER — Ambulatory Visit
Admission: RE | Admit: 2022-09-12 | Discharge: 2022-09-12 | Disposition: A | Discharge status: Home / Self Care | Payer: 59 | Source: Ambulatory Visit | Attending: Adult Health | Admitting: Adult Health

## 2022-09-12 ENCOUNTER — Ambulatory Visit
Admission: RE | Admit: 2022-09-12 | Discharge: 2022-09-12 | Disposition: A | Discharge status: Home / Self Care | Payer: Medicare Other | Source: Ambulatory Visit | Attending: Adult Health | Admitting: Adult Health

## 2022-09-12 DIAGNOSIS — K219 Gastro-esophageal reflux disease without esophagitis: Secondary | ICD-10-CM

## 2022-09-12 DIAGNOSIS — M24562 Contracture, left knee: Secondary | ICD-10-CM

## 2022-09-12 DIAGNOSIS — I951 Orthostatic hypotension: Secondary | ICD-10-CM

## 2022-09-12 DIAGNOSIS — C8339 Primary central nervous system lymphoma: Secondary | ICD-10-CM

## 2022-09-12 DIAGNOSIS — Z7689 Persons encountering health services in other specified circumstances: Secondary | ICD-10-CM

## 2022-09-12 DIAGNOSIS — C8589 Other specified types of non-Hodgkin lymphoma, extranodal and solid organ sites: Secondary | ICD-10-CM

## 2022-09-12 DIAGNOSIS — N6320 Unspecified lump in the left breast, unspecified quadrant: Secondary | ICD-10-CM

## 2022-09-12 DIAGNOSIS — R1312 Dysphagia, oropharyngeal phase: Secondary | ICD-10-CM

## 2022-09-12 DIAGNOSIS — H543 Unqualified visual loss, both eyes: Secondary | ICD-10-CM

## 2022-09-12 DIAGNOSIS — Z1231 Encounter for screening mammogram for malignant neoplasm of breast: Secondary | ICD-10-CM

## 2022-09-14 NOTE — Progress Notes (Signed)
-    lump in the upper-outer left breast is compatible with a benign lipoma -  mammogram showed no evidence of malignancy

## 2022-09-18 ENCOUNTER — Other Ambulatory Visit: Payer: Self-pay | Admitting: Adult Health

## 2022-09-18 DIAGNOSIS — I951 Orthostatic hypotension: Secondary | ICD-10-CM

## 2022-11-20 ENCOUNTER — Ambulatory Visit: Payer: 59 | Admitting: Adult Health

## 2022-11-22 ENCOUNTER — Other Ambulatory Visit: Payer: Self-pay | Admitting: Adult Health

## 2022-11-22 DIAGNOSIS — K219 Gastro-esophageal reflux disease without esophagitis: Secondary | ICD-10-CM

## 2022-11-23 ENCOUNTER — Ambulatory Visit (INDEPENDENT_AMBULATORY_CARE_PROVIDER_SITE_OTHER): Payer: 59 | Admitting: Adult Health

## 2022-11-23 ENCOUNTER — Encounter: Payer: Self-pay | Admitting: Adult Health

## 2022-11-23 VITALS — BP 124/88 | HR 98 | Temp 97.6°F | Resp 18 | Ht 62.0 in | Wt 118.0 lb

## 2022-11-23 DIAGNOSIS — M24562 Contracture, left knee: Secondary | ICD-10-CM | POA: Diagnosis not present

## 2022-11-23 DIAGNOSIS — K219 Gastro-esophageal reflux disease without esophagitis: Secondary | ICD-10-CM

## 2022-11-23 DIAGNOSIS — R110 Nausea: Secondary | ICD-10-CM

## 2022-11-23 DIAGNOSIS — C8589 Other specified types of non-Hodgkin lymphoma, extranodal and solid organ sites: Secondary | ICD-10-CM | POA: Diagnosis not present

## 2022-11-23 DIAGNOSIS — I951 Orthostatic hypotension: Secondary | ICD-10-CM

## 2022-11-23 DIAGNOSIS — R1312 Dysphagia, oropharyngeal phase: Secondary | ICD-10-CM | POA: Diagnosis not present

## 2022-11-23 MED ORDER — ONDANSETRON 8 MG PO TBDP: 8.0000 mg | ORAL_TABLET | Freq: Every day | ORAL | 3 refills | Status: DC | PRN

## 2022-11-23 MED ORDER — CYCLOBENZAPRINE HCL 5 MG PO TABS: 5.0000 mg | ORAL_TABLET | Freq: Every evening | ORAL | 1 refills | Status: AC

## 2022-11-23 MED ORDER — FAMOTIDINE 20 MG PO TABS: 20.0000 mg | ORAL_TABLET | Freq: Two times a day (BID) | ORAL | 3 refills | Status: DC | PRN

## 2022-11-23 MED ORDER — MIDODRINE HCL 5 MG PO TABS: 5.0000 mg | ORAL_TABLET | Freq: Two times a day (BID) | ORAL | 3 refills | Status: DC

## 2022-11-23 MED ORDER — VITAMIN D3 25 MCG (1000 UT) PO CAPS: 1000.0000 [IU] | ORAL_CAPSULE | Freq: Every day | ORAL | 4 refills | Status: AC

## 2022-11-23 NOTE — Progress Notes (Signed)
[facility] clinic  Provider:  Kenard Gower DNP  Code Status:   Goals of Care:     11/23/2022    3:13 PM  Advanced Directives  Does Patient Have a Medical Advance Directive? No  Would patient like information on creating a medical advance directive? No - Patient declined     Chief Complaint  Patient presents with   Follow-up    Three Months follow-up   Quality Metric Gaps    Needs to discuss Colonoscopy, Lung Cancer Screening, Medicare Annual Wellness, Covid vaccine and Dexa Scan    HPI: Patient is a 67 y.o. female seen today for a 74-month follow up. She was accompanied by her daughter. Patient has her eyes closed and responds verbally when asked by her daughter.  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, undergoes [facility] PT  Contracture of left knee - currently having outpatient PT  Orthostatic hypotension -  BP 124/88, takes Midodrine  Oropharyngeal dysphagia -  on pureed diet  Gastroesophageal reflux disease without esophagitis - takes Famotidine  Nausea - on Zofran PRN  Wt Readings from Last 3 Encounters:  11/23/22 118 lb (53.5 kg)  08/25/22 117 lb (53.1 kg)  07/07/22 117 lb (53.1 kg)     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)    Deviated nasal septum    Dyslipidemia    Emphysema of lung (HCC)    Eustachian tube dysfunction, bilateral    Fibromyalgia    Hypertrophy of inferior nasal turbinate    Mixed conductive and sensorineural hearing loss of right ear with restricted hearing of left ear    Mucoid otitis media of both ears with effusion    Neoplasm of brain, malignant (HCC)    Non-Hodgkin's lymphoma (HCC)    Nuclear sclerosis of both eyes    Ocular lymphoma (HCC)    Oral phase dysphagia     Palliative care encounter    Perforation of right tympanic membrane    Primary CNS lymphoma (HCC)    Retinal edema    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 Prefers paper tape    Outpatient Encounter Medications as of 11/23/2022  Medication Sig   acetaminophen (TYLENOL) 500 MG tablet Take 500 mg by mouth every 6 (six) hours as needed.   cetirizine (ZYRTEC) 10 MG chewable tablet Chew 1 tablet by mouth daily as needed.   cyanocobalamin (VITAMIN B12) 1000 MCG tablet Take 3 tablets by mouth daily.   famotidine (PEPCID) 20 MG tablet TAKE 1 TABLET BY MOUTH TWICE A DAY   mirtazapine (REMERON) 15 MG tablet Take 15 mg by mouth at bedtime.   ondansetron (ZOFRAN-ODT) 8 MG disintegrating tablet Take 1 tablet (8 mg total) by mouth daily as needed.  senna-docusate (SENOKOT-S) 8.6-50 MG tablet Take 1 tablet by mouth 2 (two) times daily.   [DISCONTINUED] Melatonin 10 MG TABS Take 1 tablet by mouth at bedtime.   [DISCONTINUED] midodrine (PROAMATINE) 2.5 MG tablet TAKE 1 TABLET BY MOUTH TWICE A DAY WITH A MEAL   No facility-administered encounter medications on file as of 11/23/2022.    Review of Systems:  Review of Systems  Constitutional:  Negative for appetite change, chills, fatigue and fever.  HENT:  Negative for congestion, hearing loss, rhinorrhea and sore throat.   Eyes: Negative.   Respiratory:  Negative for cough, shortness of breath and wheezing.   Cardiovascular:  Negative for chest pain, palpitations and leg swelling.  Gastrointestinal:  Negative for abdominal pain, constipation, diarrhea, nausea and  vomiting.  Genitourinary:  Negative for dysuria.  Musculoskeletal:  Negative for arthralgias, back pain and myalgias.  Skin:  Negative for color change, rash and wound.  Neurological:  Negative for dizziness, weakness and headaches.  Psychiatric/Behavioral:  Negative for behavioral problems. The patient is not nervous/anxious.     Health Maintenance  Topic Date Due   Medicare Annual Wellness (AWV)  Never done   Colonoscopy  Never done   Lung Cancer Screening  Never done   DEXA SCAN  Never done   Zoster Vaccines- Shingrix (1 of 2) 11/25/2022 (Originally 07/05/1974)   COVID-19 Vaccine (1) 06/06/2023 (Originally 07/06/1960)   Pneumonia Vaccine 45+ Years old (1 of 1 - PCV) 08/25/2023 (Originally 07/06/2019)   INFLUENZA VACCINE  01/04/2023   MAMMOGRAM  09/11/2024   Hepatitis C Screening  Completed   HPV VACCINES  Aged Out   DTaP/Tdap/Td  Discontinued    Physical Exam: Vitals:   11/23/22 1525  BP: 124/88  Pulse: 98  Resp: 18  Temp: 97.6 F (36.4 C)  SpO2: 92%  Weight: 118 lb (53.5 kg)  Height: 5\' 2"  (1.575 m)   Body mass index is 21.58 kg/m. Physical Exam Constitutional:      Appearance: Normal appearance.  HENT:     Head: Normocephalic and atraumatic.     Nose: Nose normal.     Mouth/Throat:     Mouth: Mucous membranes are moist.  Eyes:     Conjunctiva/sclera: Conjunctivae normal.  Cardiovascular:     Rate and Rhythm: Normal rate and regular rhythm.  Pulmonary:     Effort: Pulmonary effort is normal.     Breath sounds: Normal breath sounds.  Abdominal:     General: Bowel sounds are normal.     Palpations: Abdomen is soft.  Musculoskeletal:        General: Normal range of motion.     Cervical back: Normal range of motion.  Skin:    General: Skin is warm and dry.  Neurological:     General: No focal deficit present.     Mental Status: She is alert and oriented to person, place, and time.  Psychiatric:        Mood and Affect: Mood normal.        Behavior:  Behavior normal.        Thought Content: Thought content normal.        Judgment: Judgment normal.    Labs reviewed: Basic Metabolic Panel: No results for input(s): "NA", "K", "CL", "CO2", "GLUCOSE", "BUN", "CREATININE", "CALCIUM", "MG", "PHOS", "TSH" in the last 8760 hours. Liver Function Tests: No results for input(s): "AST", "ALT", "ALKPHOS", "BILITOT", "PROT", "ALBUMIN" in the last 8760 hours. No results for input(s): "LIPASE", "AMYLASE" in the last 8760  hours. No results for input(s): "AMMONIA" in the last 8760 hours. CBC: No results for input(s): "WBC", "NEUTROABS", "HGB", "HCT", "MCV", "PLT" in the last 8760 hours. Lipid Panel: Recent Labs    08/25/22 1138  CHOL 168  HDL 46*  LDLCALC 97  TRIG 478*  CHOLHDL 3.7   Lab Results  Component Value Date   HGBA1C 5.4 08/25/2022    Procedures since last visit: No results found.  Assessment/Plan  1. 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 Contracture of left knee - continue PT for therapeutic strengthening exercises -  continue Flexeril  3. Orthostatic hypotension -  BP 124/88, stable -  continue Midodrine  4. Oropharyngeal dysphagia -  on Pureed diet -  aspiration precautions  5. Gastroesophageal reflux disease without esophagitis - famotidine (PEPCID) 20 MG tablet; Take 1 tablet (20 mg total) by mouth 2 (two) times daily as needed for heartburn or indigestion.  Dispense: 180 tablet; Refill: 3  6. Nausea - ondansetron (ZOFRAN-ODT) 8 MG disintegrating tablet; Take 1 tablet (8 mg total) by mouth daily as needed.  Dispense: 30 tablet; Refill: 3   Labs/tests ordered:   None  Next appt:  Visit date not found

## 2023-01-11 LAB — CYTOPATHOLOGY NON-GYN

## 2023-02-21 ENCOUNTER — Encounter: Payer: Self-pay | Admitting: Adult Health

## 2023-02-22 ENCOUNTER — Encounter: Payer: Self-pay | Admitting: Adult Health

## 2023-02-22 ENCOUNTER — Ambulatory Visit (INDEPENDENT_AMBULATORY_CARE_PROVIDER_SITE_OTHER): Payer: 59 | Admitting: Adult Health

## 2023-02-22 VITALS — BP 90/60 | HR 97 | Temp 96.7°F | Resp 18 | Ht 62.0 in | Wt 116.8 lb

## 2023-02-22 DIAGNOSIS — Z122 Encounter for screening for malignant neoplasm of respiratory organs: Secondary | ICD-10-CM | POA: Diagnosis not present

## 2023-02-22 DIAGNOSIS — I951 Orthostatic hypotension: Secondary | ICD-10-CM | POA: Diagnosis not present

## 2023-02-22 DIAGNOSIS — M24562 Contracture, left knee: Secondary | ICD-10-CM | POA: Diagnosis not present

## 2023-02-22 DIAGNOSIS — K219 Gastro-esophageal reflux disease without esophagitis: Secondary | ICD-10-CM

## 2023-02-22 DIAGNOSIS — C8589 Other specified types of non-Hodgkin lymphoma, extranodal and solid organ sites: Secondary | ICD-10-CM

## 2023-02-22 DIAGNOSIS — Z13820 Encounter for screening for osteoporosis: Secondary | ICD-10-CM

## 2023-02-22 DIAGNOSIS — H543 Unqualified visual loss, both eyes: Secondary | ICD-10-CM

## 2023-02-22 DIAGNOSIS — E559 Vitamin D deficiency, unspecified: Secondary | ICD-10-CM

## 2023-02-22 MED ORDER — MIDODRINE HCL 5 MG PO TABS: 5.0000 mg | ORAL_TABLET | Freq: Three times a day (TID) | ORAL | 2 refills | Status: DC

## 2023-02-22 NOTE — Progress Notes (Signed)
[facility] clinic  Provider: Kenard Gower DNP  Code Status:  Full Code  Goals of Care:     02/22/2023    2:03 PM  Advanced Directives  Does Patient Have a Medical Advance Directive? No  Would patient like information on creating a medical advance directive? No - Patient declined     Chief Complaint  Patient presents with   Follow-up    3 mths f/u    Immunizations    Shingrix and influenza   Quality Metric Gaps    Medicare Annual Wellness, Colonoscopy and Lung Cancer Screening    HPI: Patient is a 67 y.o. female seen today for a 48-months follow up of chronic medical issues. She was accompanied today by her daughter.  Orthostatic hypotension - BP 90/60 today, at home BP 80/60, takes Midodrine 5 mg BID  Screening for lung cancer - has >20 year pack cigarette use, stopped 3 years ago  CNS lymphoma (HCC) -  12/25/22 MRI brain showed no evidence of recurrent CNS lymphoma  Contracture of left knee -  daughter does massaging/exercises at home  Bilateral blindness -  has Alexa (AI) and uses audible books  Gastroesophageal reflux disease without esophagitis  -  denies episode of acid reflux, takes Famotidine PRN   Wt Readings from Last 3 Encounters:  02/22/23 116 lb 12.8 oz (53 kg)  11/23/22 118 lb (53.5 kg)  08/25/22 117 lb (53.1 kg)     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)    Deviated nasal septum    Dyslipidemia    Emphysema of lung (HCC)    Eustachian tube dysfunction, bilateral    Fibromyalgia    Hypertrophy of inferior nasal turbinate    Mixed conductive and sensorineural hearing loss of right ear with restricted hearing of left ear    Mucoid otitis media of both ears with effusion    Neoplasm of brain, malignant (HCC)     Non-Hodgkin's lymphoma (HCC)    Nuclear sclerosis of both eyes    Ocular lymphoma (HCC)    Oral phase dysphagia    Palliative care encounter    Perforation of right tympanic membrane    Primary CNS lymphoma (HCC)    Retinal edema    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 Prefers paper tape    Outpatient Encounter Medications as of 02/22/2023  Medication Sig   acetaminophen (TYLENOL) 500 MG tablet Take 500 mg by mouth every 6 (six) hours as needed.   cetirizine (ZYRTEC) 10 MG chewable tablet Chew 1 tablet by mouth daily as needed.   Cholecalciferol (VITAMIN D3) 25 MCG (1000 UT) CAPS Take 1 capsule (1,000 Units total) by mouth daily.   cyanocobalamin (VITAMIN B12) 1000 MCG tablet Take 3 tablets by mouth  daily.   cyclobenzaprine (FLEXERIL) 5 MG tablet Take 1 tablet (5 mg total) by mouth at bedtime.   famotidine (PEPCID) 20 MG tablet Take 1 tablet (20 mg total) by mouth 2 (two) times daily as needed for heartburn or indigestion.   midodrine (PROAMATINE) 5 MG tablet Take 1 tablet (5 mg total) by mouth 3 (three) times daily with meals.   mirtazapine (REMERON) 15 MG tablet Take 15 mg by mouth at bedtime.   ondansetron (ZOFRAN-ODT) 8 MG disintegrating tablet Take 1 tablet (8 mg total) by mouth daily as needed.   senna-docusate (SENOKOT-S) 8.6-50 MG tablet Take 1 tablet by mouth 2 (two) times daily.   [DISCONTINUED] midodrine (PROAMATINE) 5 MG tablet Take 1 tablet (5 mg total) by mouth 2 (two) times daily with a meal.   No facility-administered encounter medications on file as of 02/22/2023.    Review of  Systems:  Review of Systems  Constitutional:  Negative for appetite change, chills, fatigue and fever.  HENT:  Negative for congestion, hearing loss, rhinorrhea and sore throat.   Eyes: Negative.   Respiratory:  Negative for cough, shortness of breath and wheezing.   Cardiovascular:  Negative for chest pain, palpitations and leg swelling.  Gastrointestinal:  Negative for abdominal pain, constipation, diarrhea, nausea and vomiting.  Genitourinary:  Negative for dysuria.  Musculoskeletal:  Negative for arthralgias, back pain and myalgias.  Skin:  Negative for color change, rash and wound.  Neurological:  Negative for dizziness, weakness and headaches.  Psychiatric/Behavioral:  Negative for behavioral problems. The patient is not nervous/anxious.     Health Maintenance  Topic Date Due   Medicare Annual Wellness (AWV)  Never done   Zoster Vaccines- Shingrix (1 of 2) Never done   Lung Cancer Screening  Never done   DEXA SCAN  Never done   COVID-19 Vaccine (1) 06/06/2023 (Originally 07/06/1960)   Pneumonia Vaccine 23+ Years old (1 of 1 - PCV) 08/25/2023 (Originally 07/06/2019)   INFLUENZA VACCINE  09/03/2023 (Originally 01/04/2023)   Colonoscopy  02/22/2024 (Originally 07/06/1999)   MAMMOGRAM  09/11/2024   Hepatitis C Screening  Completed   HPV VACCINES  Aged Out   DTaP/Tdap/Td  Discontinued    Physical Exam: Vitals:   02/22/23 1352  BP: 116/78  Pulse: 97  Resp: 18  Temp: (!) 96.7 F (35.9 C)  SpO2: 99%  Weight: 116 lb 12.8 oz (53 kg)  Height: 5\' 2"  (1.575 m)   Body mass index is 21.36 kg/m. Physical Exam Constitutional:      General: She is not in acute distress. HENT:     Head: Normocephalic and atraumatic.     Nose: Nose normal.     Mouth/Throat:     Mouth: Mucous membranes are moist.  Eyes:     Comments: Blind on bilateral eyes  Cardiovascular:     Rate and Rhythm: Normal rate and regular rhythm.  Pulmonary:     Effort: Pulmonary effort is normal.     Breath  sounds: Normal breath sounds.  Abdominal:     General: Bowel sounds are normal.     Palpations: Abdomen is soft.  Musculoskeletal:     Cervical back: Normal range of motion.     Comments: Left knee contracted  Skin:    General: Skin is warm and dry.  Neurological:     Mental Status: She is alert. Mental status is at baseline.  Psychiatric:        Mood and Affect: Mood normal.  Behavior: Behavior normal.     Labs reviewed: Basic Metabolic Panel: No results for input(s): "NA", "K", "CL", "CO2", "GLUCOSE", "BUN", "CREATININE", "CALCIUM", "MG", "PHOS", "TSH" in the last 8760 hours. Liver Function Tests: No results for input(s): "AST", "ALT", "ALKPHOS", "BILITOT", "PROT", "ALBUMIN" in the last 8760 hours. No results for input(s): "LIPASE", "AMYLASE" in the last 8760 hours. No results for input(s): "AMMONIA" in the last 8760 hours. CBC: No results for input(s): "WBC", "NEUTROABS", "HGB", "HCT", "MCV", "PLT" in the last 8760 hours. Lipid Panel: Recent Labs    08/25/22 1138  CHOL 168  HDL 46*  LDLCALC 97  TRIG 469*  CHOLHDL 3.7   Lab Results  Component Value Date   HGBA1C 5.4 08/25/2022    Procedures since last visit: No results found.  Assessment/Plan  1. Orthostatic hypotension -  BP 90/60, low -  will increase Midodrine from 5 mg BID to 5 mg TID -  monitor BP at home -  daughter is interested in a vest that helps with increasing BP - midodrine (PROAMATINE) 5 MG tablet; Take 1 tablet (5 mg total) by mouth 3 (three) times daily with meals.  Dispense: 270 tablet; Refill: 2 - Complete Metabolic Panel with eGFR - CBC with Differential/Platelets - Lipid Panel  2. Screening for lung cancer - CT CHEST LUNG CA SCREEN LOW DOSE W/O CM; Future  3. CNS lymphoma (HCC) -  MRI brain showed no evidence of recurrent CNS lymphoma  4. Contracture of left knee -  continue exercises at home  5. Bilateral blindness -  fall precautions  6. Screening for osteoporosis - DG  BONE DENSITY (DXA); Future  7. Vitamin D deficiency -  continue Vitamin D supplementation - Vitamin D, 25-hydroxy  8. Gastroesophageal reflux disease without esophagitis -  stable -  continue Famotidine PRN      Labs/tests ordered:  CBC, CMP, lipid panel, bone density, vitamin d level and CT chest lung   Next appt:  Visit date not found

## 2023-02-23 ENCOUNTER — Ambulatory Visit: Payer: 59 | Admitting: Adult Health

## 2023-02-23 LAB — COMPLETE METABOLIC PANEL WITH GFR
AG Ratio: 1.3 (calc) (ref 1.0–2.5)
ALT: 14 U/L (ref 6–29)
AST: 19 U/L (ref 10–35)
Albumin: 3.9 g/dL (ref 3.6–5.1)
Alkaline phosphatase (APISO): 114 U/L (ref 37–153)
BUN/Creatinine Ratio: 25 (calc) — ABNORMAL HIGH (ref 6–22)
BUN: 27 mg/dL — ABNORMAL HIGH (ref 7–25)
CO2: 30 mmol/L (ref 20–32)
Calcium: 9.6 mg/dL (ref 8.6–10.4)
Chloride: 101 mmol/L (ref 98–110)
Creat: 1.07 mg/dL — ABNORMAL HIGH (ref 0.50–1.05)
Globulin: 3.1 g/dL (calc) (ref 1.9–3.7)
Glucose, Bld: 147 mg/dL — ABNORMAL HIGH (ref 65–139)
Potassium: 4.2 mmol/L (ref 3.5–5.3)
Sodium: 142 mmol/L (ref 135–146)
Total Bilirubin: 0.4 mg/dL (ref 0.2–1.2)
Total Protein: 7 g/dL (ref 6.1–8.1)
eGFR: 57 mL/min/{1.73_m2} — ABNORMAL LOW (ref >=60)

## 2023-02-23 LAB — CBC WITH DIFFERENTIAL/PLATELET
Absolute Monocytes: 443 cells/uL (ref 200–950)
Basophils Absolute: 70 cells/uL (ref 0–200)
Basophils Relative: 1.3 %
Eosinophils Absolute: 49 cells/uL (ref 15–500)
Eosinophils Relative: 0.9 %
HCT: 42.3 % (ref 35.0–45.0)
Hemoglobin: 14.1 g/dL (ref 11.7–15.5)
Lymphs Abs: 1220 cells/uL (ref 850–3900)
MCH: 31.4 pg (ref 27.0–33.0)
MCHC: 33.3 g/dL (ref 32.0–36.0)
MCV: 94.2 fL (ref 80.0–100.0)
MPV: 12.4 fL (ref 7.5–12.5)
Monocytes Relative: 8.2 %
Neutro Abs: 3618 cells/uL (ref 1500–7800)
Neutrophils Relative %: 67 %
Platelets: 267 10*3/uL (ref 140–400)
RBC: 4.49 10*6/uL (ref 3.80–5.10)
RDW: 12 % (ref 11.0–15.0)
Total Lymphocyte: 22.6 %
WBC: 5.4 10*3/uL (ref 3.8–10.8)

## 2023-02-23 LAB — VITAMIN D 25 HYDROXY (VIT D DEFICIENCY, FRACTURES): Vit D, 25-Hydroxy: 57 ng/mL (ref 30–100)

## 2023-02-23 LAB — LIPID PANEL
Cholesterol: 182 mg/dL (ref <200)
HDL: 49 mg/dL — ABNORMAL LOW (ref >=50)
LDL Cholesterol (Calc): 104 mg/dL (calc) — ABNORMAL HIGH
Non-HDL Cholesterol (Calc): 133 mg/dL (calc) — ABNORMAL HIGH (ref <130)
Total CHOL/HDL Ratio: 3.7 (calc) (ref <5.0)
Triglycerides: 174 mg/dL — ABNORMAL HIGH (ref <150)

## 2023-02-27 ENCOUNTER — Telehealth: Payer: Self-pay

## 2023-02-27 DIAGNOSIS — Z122 Encounter for screening for malignant neoplasm of respiratory organs: Secondary | ICD-10-CM

## 2023-02-27 NOTE — Telephone Encounter (Signed)
Below is Medina-Vargas, Monina C, NP response:    Medina-Vargas, Monina C, NP  You42 minutes ago (11:13 AM)    Signed order for CT at [facility].  Thanks

## 2023-02-27 NOTE — Telephone Encounter (Signed)
Verlon Au with [facility] called stating the order for CT will need to be resubmitted for the hospital, as patient is unable to stand and pivot on her own and that is a requirement for them to perform imaging,   I called patient and she prefers imaging at [facility]. Order is pending for Medina-Vargas, Monina C, NP to review and sign.  Side note: other order deleted

## 2023-03-04 ENCOUNTER — Ambulatory Visit (HOSPITAL_BASED_OUTPATIENT_CLINIC_OR_DEPARTMENT_OTHER): Payer: 59

## 2023-03-04 NOTE — Progress Notes (Signed)
-    electrolytes normal, kidney function stable, liver enzymes normal -  triglycerides 174, up from 154 (08/25/22 and LDL 104, up from 97 (08/25/22), can repeat fasting lipids in 3 months -  CBC, all normal, no anemia -  vit D normal level

## 2023-03-11 ENCOUNTER — Ambulatory Visit (HOSPITAL_BASED_OUTPATIENT_CLINIC_OR_DEPARTMENT_OTHER)
Admission: RE | Admit: 2023-03-11 | Discharge: 2023-03-11 | Disposition: A | Discharge status: Home / Self Care | Payer: 59 | Source: Ambulatory Visit | Attending: Adult Health | Admitting: Adult Health

## 2023-03-11 DIAGNOSIS — Z87891 Personal history of nicotine dependence: Secondary | ICD-10-CM | POA: Diagnosis not present

## 2023-03-11 DIAGNOSIS — Z122 Encounter for screening for malignant neoplasm of respiratory organs: Secondary | ICD-10-CM | POA: Diagnosis present

## 2023-03-12 ENCOUNTER — Ambulatory Visit (INDEPENDENT_AMBULATORY_CARE_PROVIDER_SITE_OTHER): Payer: 59 | Admitting: Adult Health

## 2023-03-12 ENCOUNTER — Encounter: Payer: Self-pay | Admitting: Adult Health

## 2023-03-12 VITALS — Ht 62.0 in

## 2023-03-12 DIAGNOSIS — Z1211 Encounter for screening for malignant neoplasm of colon: Secondary | ICD-10-CM | POA: Diagnosis not present

## 2023-03-12 DIAGNOSIS — Z0000 Encounter for general adult medical examination without abnormal findings: Secondary | ICD-10-CM | POA: Diagnosis not present

## 2023-03-12 NOTE — Progress Notes (Signed)
This service is provided via telemedicine  No vital signs collected/recorded due to the encounter was a telemedicine visit.   Location of patient (ex: home, work):  Home   Patient consents to a telephone visit:  Yes, 03/12/2023  Location of the provider (ex: office, home):  [facility]  Name of any referring provider: Medina-Vargas, Margit Banda, NP   Names of all persons participating in the telemedicine service and their role in the encounter: Cienna Dumais B/CMA, Medina-Vargas, Monina C, NP , and patient  Time spent on call:  11 minutes

## 2023-03-12 NOTE — Progress Notes (Signed)
Subjective:   Krystal Stevens is a 67 y.o. female who presents for an Initial Medicare Annual Wellness Visit.  Visit Complete: Virtual  I connected with  Krystal Stevens on 03/12/23 by a video and audio enabled telemedicine application and verified that I am speaking with the correct person using two identifiers.  Patient Location: Home  Provider Location: Home Office  I discussed the limitations of evaluation and management by telemedicine. The patient expressed understanding and agreed to proceed.  Patient Medicare AWV questionnaire was completed by the patient on 03/12/23; I have confirmed that all information answered by patient is correct and no changes since this date.  Cardiac Risk Factors include: advanced age (>54men, >20 women);dyslipidemia     Objective:    Today's Vitals   03/12/23 1127  Height: 5\' 2"  (1.575 m)   Body mass index is 21.36 kg/m.     03/12/2023   11:28 AM 02/22/2023    2:03 PM 11/23/2022    3:13 PM 01/22/2014    3:52 PM  Advanced Directives  Does Patient Have a Medical Advance Directive? No No No No  Would patient like information on creating a medical advance directive? No - Patient declined No - Patient declined No - Patient declined No - patient declined information    Current Medications (verified) Outpatient Encounter Medications as of 03/12/2023  Medication Sig   acetaminophen (TYLENOL) 500 MG tablet Take 500 mg by mouth every 6 (six) hours as needed.   cetirizine (ZYRTEC) 10 MG chewable tablet Chew 1 tablet by mouth daily as needed.   Cholecalciferol (VITAMIN D3) 25 MCG (1000 UT) CAPS Take 1 capsule (1,000 Units total) by mouth daily.   cyanocobalamin (VITAMIN B12) 1000 MCG tablet Take 3 tablets by mouth daily.   cyclobenzaprine (FLEXERIL) 5 MG tablet Take 1 tablet (5 mg total) by mouth at bedtime.   famotidine (PEPCID) 20 MG tablet Take 1 tablet (20 mg total) by mouth 2 (two) times daily as needed for heartburn or indigestion.    midodrine (PROAMATINE) 5 MG tablet Take 1 tablet (5 mg total) by mouth 3 (three) times daily with meals.   mirtazapine (REMERON) 15 MG tablet Take 15 mg by mouth at bedtime.   ondansetron (ZOFRAN-ODT) 8 MG disintegrating tablet Take 1 tablet (8 mg total) by mouth daily as needed.   senna-docusate (SENOKOT-S) 8.6-50 MG tablet Take 1 tablet by mouth 2 (two) times daily.   No facility-administered encounter medications on file as of 03/12/2023.    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)    Deviated nasal septum    Dyslipidemia    Emphysema of lung (HCC)    Eustachian tube dysfunction, bilateral    Fibromyalgia    Hypertrophy of inferior nasal turbinate    Mixed conductive and sensorineural hearing loss of right ear with restricted hearing of left ear    Mucoid otitis media of both ears with effusion    Neoplasm of brain, malignant (HCC)    Non-Hodgkin's lymphoma (HCC)    Nuclear sclerosis of both eyes    Ocular lymphoma (HCC)    Oral phase dysphagia    Palliative care encounter    Perforation of right tympanic membrane  Primary CNS lymphoma    Retinal edema    Sensorineural hearing loss (SNHL) of left ear with restricted hearing of right ear    Sensorineural hearing loss (SNHL), bilateral    Situational depression    Past Surgical History:  Procedure Laterality Date   BRAIN BIOPSY     CATARACT EXTRACTION     CHOLECYSTECTOMY     TONSILLECTOMY     TUBAL LIGATION     Family History  Problem Relation Age of Onset   Diabetes Mother    Emphysema Mother    Lung cancer Father    Heart Problems Daughter        Pacemaker   Heart block Daughter    Social History   Socioeconomic History   Marital  status: Married    Spouse name: Not on file   Number of children: Not on file   Years of education: Not on file   Highest education level: Not on file  Occupational History   Not on file  Tobacco Use   Smoking status: Former    Current packs/day: 0.00    Types: Cigarettes    Quit date: 2022    Years since quitting: 2.7   Smokeless tobacco: Not on file  Vaping Use   Vaping status: Never Used  Substance and Sexual Activity   Alcohol use: Yes    Comment: occasionally wine   Drug use: Not Currently   Sexual activity: Not on file  Other Topics Concern   Not on file  Social History Narrative   ** Merged History Encounter **       Social Determinants of Health   Financial Resource Strain: Low Risk  (03/12/2023)   Overall Financial Resource Strain (CARDIA)    Difficulty of Paying Living Expenses: Not very hard  Food Insecurity: No Food Insecurity (03/12/2023)   Hunger Vital Sign    Worried About Running Out of Food in the Last Year: Never true    Ran Out of Food in the Last Year: Never true  Transportation Needs: No Transportation Needs (03/12/2023)   PRAPARE - Administrator, Civil Service (Medical): No    Lack of Transportation (Non-Medical): No  Physical Activity: Unknown (03/12/2023)   Exercise Vital Sign    Days of Exercise per Week: Patient unable to answer    Minutes of Exercise per Session: 0 min  Stress: No Stress Concern Present (03/12/2023)   [employer] of Occupational Health - Occupational Stress Questionnaire    Feeling of Stress : Not at all  Social Connections: Unknown (03/12/2023)   Social Connection and Isolation Panel [NHANES]    Frequency of Communication with Friends and Family: Not on file    Frequency of Social Gatherings with Friends and Family: Not on file    Attends Religious Services: More than 4 times per year    Active Member of [employer] or Organizations: No    Attends Banker Meetings: Never    Marital Status: Widowed     Tobacco Counseling Counseling given: Not Answered   Clinical Intake:  Pre-visit preparation completed: No  Pain : No/denies pain     BMI - recorded: 21.36 Nutritional Status: BMI of 19-24  Normal Nutritional Risks: None  How often do you need to have someone help you when you read instructions, pamphlets, or other written materials from your doctor or pharmacy?: 5 - Always What is the last grade level you completed in school?: 7th Grade  Interpreter Needed?: No  Information entered by :: Joreen Swearingin Medina-Vargas DNP   Activities of Daily Living    03/12/2023    1:35 PM  In your present state of health, do you have any difficulty performing the following activities:  Hearing? 0  Vision? 1  Difficulty concentrating or making decisions? 1  Walking or climbing stairs? 1  Dressing or bathing? 1  Doing errands, shopping? 1  Preparing Food and eating ? Y  Using the Toilet? Y  In the past six months, have you accidently leaked urine? Y  Do you have problems with loss of bowel control? Y  Managing your Medications? Y  Managing your Finances? Y  Housekeeping or managing your Housekeeping? Y    Patient Care Team: Medina-Vargas, Margit Banda, NP as PCP - General (Internal Medicine)  Indicate any recent Medical Services you may have received from other than [facility] providers in the past year (date may be approximate).     Assessment:   This is a routine wellness examination for [place].  Hearing/Vision screen No results found.   Goals Addressed             This Visit's Progress    Exercise 3x per week (30 min per time)       Daughter helps with her exercise X 40 minutes daily, electric pedals      Depression Screen    03/12/2023    1:16 PM 02/22/2023    2:03 PM 08/25/2022   10:54 AM  PHQ 2/9 Scores  PHQ - 2 Score 0 0 0  PHQ- 9 Score 0      Fall Risk    03/12/2023    1:06 PM 02/22/2023    2:03 PM  Fall Risk   Falls in the past year? 0 0  Number falls in past  yr: 0 0  Injury with Fall? 0 0  Risk for fall due to : No Fall Risks No Fall Risks;Other (Comment)  Follow up Falls evaluation completed Falls evaluation completed    MEDICARE RISK AT HOME:    TIMED UP AND GO:  Was the test performed? No    Cognitive Function:        03/12/2023    1:07 PM  6CIT Screen  What Year? 4 points  What month? 3 points  What time? 0 points  Count back from 20 0 points  Months in reverse 0 points  Repeat phrase 0 points  Total Score 7 points    Immunizations Immunization History  Administered Date(s) Administered   Influenza, High Dose Seasonal PF 03/11/2020    TDAP status: Due, Education has been provided regarding the importance of this vaccine. Advised may receive this vaccine at local pharmacy or Health Dept. Aware to provide a copy of the vaccination record if obtained from local pharmacy or Health Dept. Verbalized acceptance and understanding.  Flu Vaccine status: Declined, Education has been provided regarding the importance of this vaccine but patient still declined. Advised may receive this vaccine at local pharmacy or Health Dept. Aware to provide a copy of the vaccination record if obtained from local pharmacy or Health Dept. Verbalized acceptance and understanding.  Pneumococcal vaccine status: Declined,  Education has been provided regarding the importance of this vaccine but patient still declined. Advised may receive this vaccine at local pharmacy or Health Dept. Aware to provide a copy of the vaccination record if obtained from local pharmacy or Health Dept. Verbalized acceptance and understanding.   Covid-19 vaccine status: Information provided on how to  obtain vaccines.   Qualifies for Shingles Vaccine? Yes   Zostavax completed No   Shingrix Completed?: No.    Education has been provided regarding the importance of this vaccine. Patient has been advised to call insurance company to determine out of pocket expense if they have not  yet received this vaccine. Advised may also receive vaccine at local pharmacy or Health Dept. Verbalized acceptance and understanding.  Screening Tests Health Maintenance  Topic Date Due   Zoster Vaccines- Shingrix (1 of 2) Never done   Lung Cancer Screening  Never done   DEXA SCAN  Never done   COVID-19 Vaccine (1) 06/06/2023 (Originally 07/06/1960)   Pneumonia Vaccine 76+ Years old (1 of 1 - PCV) 08/25/2023 (Originally 07/06/2019)   INFLUENZA VACCINE  09/03/2023 (Originally 01/04/2023)   Colonoscopy  02/22/2024 (Originally 07/06/1999)   Medicare Annual Wellness (AWV)  03/11/2024   MAMMOGRAM  09/11/2024   Hepatitis C Screening  Completed   HPV VACCINES  Aged Out   DTaP/Tdap/Td  Discontinued    Health Maintenance  Health Maintenance Due  Topic Date Due   Zoster Vaccines- Shingrix (1 of 2) Never done   Lung Cancer Screening  Never done   DEXA SCAN  Never done    Colorectal cancer screening: Type of screening: Cologuard. Completed Has not had one. Repeat every 3 years  Mammogram status: Completed 09/12/22. Repeat every year  Bone Density status: Ordered 07/10/22. Pt provided with contact info and advised to call to schedule appt.  Lung Cancer Screening: (Low Dose CT Chest recommended if Age 18-80 years, 20 pack-year currently smoking OR have quit w/in 15years.) does qualify.   Lung Cancer Screening Referral: No  Additional Screening:  Hepatitis C Screening: does qualify; Completed 08/25/22  Vision Screening: Recommended annual ophthalmology exams for early detection of glaucoma and other disorders of the eye. Is the patient up to date with their annual eye exam?  Yes  Who is the provider or what is the name of the office in which the patient attends annual eye exams? [facility] If pt is not established with a provider, would they like to be referred to a provider to establish care? No .   Dental Screening: Recommended annual dental exams for proper oral  hygiene  Diabetic Foot Exam: Diabetic Foot Exam: Completed Not diabetic  Community Resource Referral / Chronic Care Management: CRR required this visit?  No   CCM required this visit?  No     Plan:     I have personally reviewed and noted the following in the patient's chart:   Medical and social history Use of alcohol, tobacco or illicit drugs  Current medications and supplements including opioid prescriptions. Patient is not currently taking opioid prescriptions. Functional ability and status Nutritional status Physical activity Advanced directives List of other physicians Hospitalizations, surgeries, and ER visits in previous 12 months Vitals Screenings to include cognitive, depression, and falls Referrals and appointments  In addition, I have reviewed and discussed with patient certain preventive protocols, quality metrics, and best practice recommendations. A written personalized care plan for preventive services as well as general preventive health recommendations were provided to patient.     Kadin Canipe Medina-Vargas, NP   03/12/2023   After Visit Summary: (Mail) Due to this being a telephonic visit, the after visit summary with patients personalized plan was offered to patient via mail   Nurse Notes: needs to be done annually.

## 2023-03-12 NOTE — Progress Notes (Signed)
I connected with  Krystal Stevens on 03/12/23 by a video enabled telemedicine application and verified that I am speaking with the correct person using two identifiers.   I discussed the limitations of evaluation and management by telemedicine. The patient expressed understanding and agreed to proceed.

## 2023-03-26 NOTE — Progress Notes (Signed)
-     there is a non specific basilar predominant ill-defined centrilobular nodularity, noted as probable aspiration or infectious bronchiolitis,  -  does she have chills, fever, shortness of breath, wheezing?

## 2023-03-29 ENCOUNTER — Other Ambulatory Visit: Payer: Self-pay | Admitting: Adult Health

## 2023-03-29 DIAGNOSIS — J690 Pneumonitis due to inhalation of food and vomit: Secondary | ICD-10-CM

## 2023-03-29 MED ORDER — AZITHROMYCIN 250 MG PO TABS: ORAL_TABLET | ORAL | 0 refills | Status: AC

## 2023-05-13 LAB — COLOGUARD: COLOGUARD: NEGATIVE

## 2023-05-14 NOTE — Progress Notes (Signed)
-    cologuard negative test for colorectal cancer.

## 2023-05-24 ENCOUNTER — Encounter (INDEPENDENT_AMBULATORY_CARE_PROVIDER_SITE_OTHER): Payer: 59 | Admitting: Adult Health

## 2023-05-24 NOTE — Progress Notes (Signed)
O show This encounter was created in error - please disregard.

## 2023-06-01 ENCOUNTER — Telehealth: Payer: Self-pay

## 2023-06-01 NOTE — Transitions of Care (Post Inpatient/ED Visit) (Unsigned)
   06/01/2023  Name: Krystal Stevens MRN: [id number] DOB: [birth year]  Today's TOC FU Call Status: Today's TOC FU Call Status:: Unsuccessful Call (1st Attempt) Unsuccessful Call (1st Attempt) Date: 06/01/23  Attempted to reach the patient regarding the most recent Inpatient/ED visit. Called patient and no answer. Voicemail was left with office call back number.    Follow Up Plan: Additional outreach attempts will be made to reach the patient to complete the Transitions of Care (Post Inpatient/ED visit) call.   Signature : Franziska Podgurski.D/RMA

## 2023-06-04 NOTE — Transitions of Care (Post Inpatient/ED Visit) (Addendum)
   06/04/2023  Name: Krystal Stevens MRN: [id number] DOB: Aug 06, 1955  Today's TOC FU Call Status: Today's TOC FU Call Status:: Successful TOC FU Call Completed Unsuccessful Call (1st Attempt) Date: 06/01/23 [facility] FU Call Complete Date: 06/04/23 Patient's Name and Date of Birth confirmed.  Transition Care Management Follow-up Telephone Call Date of Discharge: 05/25/23 Discharge Facility: Other (Non[facility] Facility) Name of Other ([facility]) [facility]charge Facility: [facility] Type of Discharge: Emergency Department Reason for ED Visit: Other: (UTI) How have you been since you were released from the hospital?: Better Any questions or concerns?: Yes Patient Questions/Concerns:: Patient will save questions for appointment  Items Reviewed: Did you receive and understand the discharge instructions provided?: Yes Medications obtained,verified, and reconciled?: Yes (Medications Reviewed) Any new allergies since your discharge?: No Dietary orders reviewed?: No Do you have support at home?: Yes People in Home: child(ren), adult Name of Support/Comfort Primary Source: Leida Lauth  Medications Reviewed Today: Medications Reviewed Today     Reviewed by Dicky Doe, CMA (Certified Medical Assistant) on 06/04/23 at [phone]  Med List Status: <None>   Medication Order Taking? Sig Documenting Provider Last Dose Status Informant  acetaminophen (TYLENOL) 500 MG tablet [id number] Yes Take 500 mg by mouth every 6 (six) hours as needed. [provider] Taking Active   cetirizine (ZYRTEC) 10 MG chewable tablet [id number] Yes Chew 1 tablet by mouth daily as needed. [provider] Taking Active   Cholecalciferol (VITAMIN D3) 25 MCG (1000 UT) CAPS [id number] Yes Take 1 capsule (1,000 Units total) by mouth daily. Medina-Vargas, Monina C, NP Taking Active   cyanocobalamin (VITAMIN B12) 1000 MCG tablet [id number] Yes Take 3 tablets by mouth daily. [provider] Taking Active    cyclobenzaprine (FLEXERIL) 5 MG tablet [id number] Yes Take 1 tablet (5 mg total) by mouth at bedtime. Medina-Vargas, Monina C, NP Taking Active   famotidine (PEPCID) 20 MG tablet [id number] Yes Take 1 tablet (20 mg total) by mouth 2 (two) times daily as needed for heartburn or indigestion. Medina-Vargas, Monina C, NP Taking Active   midodrine (PROAMATINE) 5 MG tablet [id number] Yes Take 1 tablet (5 mg total) by mouth 3 (three) times daily with meals. Medina-Vargas, Monina C, NP Taking Active   mirtazapine (REMERON) 15 MG tablet [id number] Yes Take 15 mg by mouth at bedtime. [provider] Taking Active   ondansetron (ZOFRAN-ODT) 8 MG disintegrating tablet [id number] Yes Take 1 tablet (8 mg total) by mouth daily as needed. Medina-Vargas, Monina C, NP Taking Active   senna-docusate (SENOKOT-S) 8.6-50 MG tablet [id number] Yes Take 1 tablet by mouth 2 (two) times daily. [provider] Taking Active             Home Care and Equipment/Supplies: Were Home Health Services Ordered?: No Any new equipment or medical supplies ordered?: No  Functional Questionnaire: Do you need assistance with bathing/showering or dressing?: No Do you need assistance with meal preparation?: No Do you need assistance with eating?: No Do you have difficulty maintaining continence: No Do you need assistance with getting out of bed/getting out of a chair/moving?: No Do you have difficulty managing or taking your medications?: No  Follow up appointments reviewed: PCP Follow-up appointment confirmed?: Yes Date of PCP follow-up appointment?: 06/07/23 Follow-up Provider: Ella Bodo, NP [facility] Follow-up appointment confirmed?: No Do you need transportation to your follow-up appointment?: No Do you understand care options if your condition(s) worsen?: Yes-patient verbalized understanding    SIGNATURE: Ivalee Strauser.D/RMA

## 2023-06-07 ENCOUNTER — Encounter: Payer: 59 | Admitting: Adult Health

## 2023-06-11 ENCOUNTER — Ambulatory Visit (INDEPENDENT_AMBULATORY_CARE_PROVIDER_SITE_OTHER): Payer: 59 | Admitting: Adult Health

## 2023-06-11 ENCOUNTER — Encounter: Payer: Self-pay | Admitting: Adult Health

## 2023-06-11 VITALS — BP 122/78 | HR 78 | Temp 97.0°F | Resp 18 | Ht 62.0 in | Wt 118.0 lb

## 2023-06-11 DIAGNOSIS — C8339 Primary central nervous system lymphoma: Secondary | ICD-10-CM

## 2023-06-11 DIAGNOSIS — I951 Orthostatic hypotension: Secondary | ICD-10-CM | POA: Diagnosis not present

## 2023-06-11 DIAGNOSIS — J309 Allergic rhinitis, unspecified: Secondary | ICD-10-CM

## 2023-06-11 DIAGNOSIS — R630 Anorexia: Secondary | ICD-10-CM

## 2023-06-11 DIAGNOSIS — R110 Nausea: Secondary | ICD-10-CM

## 2023-06-11 DIAGNOSIS — N390 Urinary tract infection, site not specified: Secondary | ICD-10-CM

## 2023-06-11 DIAGNOSIS — H543 Unqualified visual loss, both eyes: Secondary | ICD-10-CM

## 2023-06-11 DIAGNOSIS — K219 Gastro-esophageal reflux disease without esophagitis: Secondary | ICD-10-CM

## 2023-06-11 DIAGNOSIS — H6123 Impacted cerumen, bilateral: Secondary | ICD-10-CM

## 2023-06-11 MED ORDER — MIDODRINE HCL 5 MG PO TABS: 5.0000 mg | ORAL_TABLET | Freq: Two times a day (BID) | ORAL | 2 refills | Status: DC

## 2023-06-11 MED ORDER — DEBROX 6.5 % OT SOLN: 5.0000 [drp] | Freq: Two times a day (BID) | OTIC | 0 refills | Status: AC

## 2023-06-11 MED ORDER — ONDANSETRON 8 MG PO TBDP: 8.0000 mg | ORAL_TABLET | Freq: Every day | ORAL | 3 refills | Status: AC | PRN

## 2023-06-11 MED ORDER — FEXOFENADINE HCL 180 MG PO TABS: 180.0000 mg | ORAL_TABLET | Freq: Every day | ORAL | 3 refills | Status: DC

## 2023-06-11 NOTE — Progress Notes (Signed)
 [facility]  Provider:  Jereld Serum DNP  Code Status:  Full Code  Goals of Care:     06/11/2023    2:17 PM  Advanced Directives  Does Patient Have a Medical Advance Directive? Yes  Type of Advance Directive Healthcare Power of Attorney  Does patient want to make changes to medical advance directive? No - Patient declined  Copy of Healthcare Power of Attorney in Chart? No - copy requested  Would patient like information on creating a medical advance directive? No - Patient declined     Chief Complaint  Patient presents with   Transitions Of Care    Hospital visit    HPI: Patient is a 68 y.o. female seen today for transition of care. She was accompanied today by her daughter and grandson. She was seen at [facility] ED on 05/25/23 for a syncopal episode. She was taken to the bathroom by her nephew and daughter heard a grunt and patient was out and became stiff for over 30 seconds. Patient was noted to be sweating profusely. Of note, she was lethargic for the past 2 weeks before hospitalization. It was difficult for family to get het to drink anything. Urinalysis was + for nitrites and wbc 11,000, electrolytes normal, BUN elevated. She was given 2L of IV fluids and her mentation improved. She was started on Keflex for UTI. CT head and neck  with no acute findings.  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)    Deviated nasal septum    Dyslipidemia    Emphysema of lung (HCC)    Eustachian tube dysfunction, bilateral    Fibromyalgia    Hypertrophy of inferior nasal turbinate    Mixed conductive and sensorineural hearing loss of right ear with restricted hearing of left ear    Mucoid otitis media of both ears with effusion    Neoplasm of brain, malignant (HCC)    Non-Hodgkin's lymphoma (HCC)    Nuclear sclerosis of both eyes    Ocular lymphoma (HCC)    Oral phase dysphagia    Palliative care encounter    Perforation of right tympanic membrane    Primary CNS lymphoma    Retinal edema    Sensorineural hearing loss (SNHL) of left ear with restricted hearing of right ear    Sensorineural hearing loss (SNHL), bilateral    Situational depression     Past Surgical History:  Procedure Laterality Date   BRAIN BIOPSY     CATARACT EXTRACTION     CHOLECYSTECTOMY     TONSILLECTOMY     TUBAL LIGATION      Allergies  Allergen Reactions   Bee Venom  Swelling   Tape Other (See Comments) and Rash    Makes her arm red. Prefers paper tape    Outpatient Encounter Medications as of 06/11/2023  Medication Sig   acetaminophen  (TYLENOL ) 500 MG tablet Take 500 mg by mouth every 6 (six) hours as needed.   carbamide peroxide (DEBROX) 6.5 % OTIC solution Place 5 drops into both ears 2 (two) times daily for 4 days.   Cholecalciferol (VITAMIN D3) 25 MCG (1000 UT) CAPS Take 1 capsule (1,000 Units total) by mouth daily.   cyanocobalamin (VITAMIN B12) 1000 MCG tablet Take 3 tablets by mouth daily.   cyclobenzaprine  (FLEXERIL ) 5 MG tablet Take 1 tablet (5 mg total) by mouth at bedtime.   fexofenadine  (ALLEGRA  ALLERGY) 180 MG tablet Take 1 tablet (180 mg total) by mouth daily.    senna-docusate (SENOKOT-S) 8.6-50 MG tablet Take 1 tablet by mouth 2 (two) times daily.   [DISCONTINUED] midodrine  (PROAMATINE ) 5 MG tablet Take 1 tablet (5 mg total) by mouth 3 (three) times daily with meals.   [DISCONTINUED] ondansetron  (ZOFRAN -ODT) 8 MG disintegrating tablet Take 1 tablet (8 mg total) by mouth daily as needed.   cetirizine (ZYRTEC) 10 MG chewable tablet Chew 1 tablet by mouth daily as needed. (Patient not taking: Reported on 06/11/2023)   midodrine  (PROAMATINE ) 5 MG tablet Take 1 tablet (5 mg total) by mouth 2 (two) times daily.   mirtazapine (REMERON) 15 MG tablet Take 15 mg by mouth at bedtime. (Patient not taking: Reported on 06/11/2023)   ondansetron  (ZOFRAN -ODT) 8 MG disintegrating tablet Take 1 tablet (8 mg total) by mouth daily as needed.   [DISCONTINUED] famotidine  (PEPCID ) 20 MG tablet Take 1 tablet (20 mg total) by mouth 2 (two) times daily as needed for heartburn or indigestion. (Patient not taking: Reported on 06/11/2023)   No facility-administered encounter medications on file as of 06/11/2023.    Review of Systems:  Review of Systems  Unable to obtain due to dementia.  Health Maintenance  Topic Date Due   COVID-19 Vaccine (1) Never done   Zoster Vaccines- Shingrix (1 of 2) Never done   DEXA SCAN  Never done   Pneumonia Vaccine 7+ Years old (1 of 2 - PCV) 08/25/2023 (Originally 07/05/1961)   INFLUENZA VACCINE  09/03/2023 (Originally 01/04/2023)   Colonoscopy  02/22/2024 (Originally 07/06/1999)   Lung Cancer Screening  03/10/2024   Medicare Annual Wellness (AWV)  03/11/2024   MAMMOGRAM  09/11/2024   Hepatitis C Screening  Completed   HPV VACCINES  Aged Out   DTaP/Tdap/Td  Discontinued    Physical Exam: Vitals:   06/11/23 1421  BP: 122/78  Pulse: 78  Resp: 18  Temp: (!) 97 F (36.1 C)  SpO2: 96%  Weight: 118 lb (53.5 kg)  Height: 5' 2 (1.575 m)   Body mass index is 21.58 kg/m. Physical Exam Constitutional:      Appearance: Normal appearance.  HENT:      Head: Normocephalic and atraumatic.     Right Ear: There is impacted cerumen.     Left Ear: There is impacted cerumen.     Nose: Nose normal.     Mouth/Throat:     Mouth: Mucous membranes are moist.  Eyes:     Conjunctiva/sclera: Conjunctivae normal.     Comments: Bilateral blindness  Cardiovascular:     Rate and Rhythm: Normal rate and regular rhythm.  Pulmonary:     Effort: Pulmonary effort is normal.     Breath sounds: Normal breath sounds.  Abdominal:     General: Bowel sounds are normal.     Palpations: Abdomen is soft.  Skin:    General: Skin is warm and dry.  Neurological:     Mental Status: She is alert.  Psychiatric:        Mood and Affect: Mood normal.        Behavior: Behavior normal.     Labs reviewed: Basic Metabolic Panel: Recent Labs    02/22/23 1445 06/11/23 1500  NA 142 143  K 4.2 4.1  CL 101 102  CO2 30 30  GLUCOSE 147* 88  BUN 27* 19  CREATININE 1.07* 1.09*  CALCIUM 9.6 9.7   Liver Function Tests: Recent Labs    02/22/23 1445  AST 19  ALT 14  BILITOT 0.4  PROT 7.0   No results for input(s): LIPASE, AMYLASE in the last 8760 hours. No results for input(s): AMMONIA in the last 8760 hours. CBC: Recent Labs    02/22/23 1445 06/11/23 1500  WBC 5.4 5.5  NEUTROABS 3,618 3,262  HGB 14.1 14.2  HCT 42.3 42.9  MCV 94.2 96.2  PLT 267 271   Lipid Panel: Recent Labs    08/25/22 1138 02/22/23 1445  CHOL 168 182  HDL 46* 49*  LDLCALC 97 104*  TRIG 158* 174*  CHOLHDL 3.7 3.7   Lab Results  Component Value Date   HGBA1C 5.4 08/25/2022    Procedures since last visit: No results found.  Assessment/Plan  1. Urinary tract infection without hematuria, site unspecified (Primary) -  completed Keflex -  resolved  2. Orthostatic hypotension -  BP 110/80, stable - midodrine  (PROAMATINE ) 5 MG tablet; Take 1 tablet (5 mg total) by mouth 2 (two) times daily.  Dispense: 270 tablet; Refill: 2  3. CNS lymphoma -  follows up  with oncology  4. Bilateral blindness -  eye care daily  5. Cerumen debris on tympanic membrane of both ears - carbamide peroxide (DEBROX) 6.5 % OTIC solution; Place 5 drops into both ears 2 (two) times daily for 4 days.  Dispense: 2 mL; Refill: 0 -  will need bilateral ear irrigation in the clinic on 5th day  6. Allergic rhinitis, unspecified seasonality, unspecified trigger - fexofenadine  (ALLEGRA  ALLERGY) 180 MG tablet; Take 1 tablet (180 mg total) by mouth daily.  Dispense: 30 tablet; Refill: 3  7. Poor appetite -  continue to offer fluids - CBC With Differential/Platelet - Basic Metabolic Panel with eGFR  8. Nausea - ondansetron  (ZOFRAN -ODT) 8 MG disintegrating tablet; Take 1 tablet (8 mg total) by mouth daily as needed.  Dispense: 30 tablet; Refill: 3    Labs/tests ordered:  CBC and BMP   Next appt:  Visit date not found

## 2023-06-12 LAB — CBC WITH DIFFERENTIAL/PLATELET
Absolute Lymphocytes: 1634 {cells}/uL (ref 850–3900)
Absolute Monocytes: 473 {cells}/uL (ref 200–950)
Basophils Absolute: 72 {cells}/uL (ref 0–200)
Basophils Relative: 1.3 %
Eosinophils Absolute: 61 {cells}/uL (ref 15–500)
Eosinophils Relative: 1.1 %
HCT: 42.9 % (ref 35.0–45.0)
Hemoglobin: 14.2 g/dL (ref 11.7–15.5)
MCH: 31.8 pg (ref 27.0–33.0)
MCHC: 33.1 g/dL (ref 32.0–36.0)
MCV: 96.2 fL (ref 80.0–100.0)
MPV: 11.6 fL (ref 7.5–12.5)
Monocytes Relative: 8.6 %
Neutro Abs: 3262 {cells}/uL (ref 1500–7800)
Neutrophils Relative %: 59.3 %
Platelets: 271 10*3/uL (ref 140–400)
RBC: 4.46 10*6/uL (ref 3.80–5.10)
RDW: 11.9 % (ref 11.0–15.0)
Total Lymphocyte: 29.7 %
WBC: 5.5 10*3/uL (ref 3.8–10.8)

## 2023-06-12 LAB — BASIC METABOLIC PANEL WITH GFR
BUN/Creatinine Ratio: 17 (calc) (ref 6–22)
BUN: 19 mg/dL (ref 7–25)
CO2: 30 mmol/L (ref 20–32)
Calcium: 9.7 mg/dL (ref 8.6–10.4)
Chloride: 102 mmol/L (ref 98–110)
Creat: 1.09 mg/dL — ABNORMAL HIGH (ref 0.50–1.05)
Glucose, Bld: 88 mg/dL (ref 65–99)
Potassium: 4.1 mmol/L (ref 3.5–5.3)
Sodium: 143 mmol/L (ref 135–146)
eGFR: 55 mL/min/{1.73_m2} — ABNORMAL LOW (ref >=60)

## 2023-06-12 NOTE — Progress Notes (Signed)
-   metabolic panel is stable and same as the one taken 3 weeks ago and 3 months ago. She does not need IV fluids but needs to continue giving fluids. -  cbc all normal, no infection

## 2023-07-11 ENCOUNTER — Telehealth: Payer: Self-pay

## 2023-07-11 ENCOUNTER — Other Ambulatory Visit: Payer: Self-pay | Admitting: Adult Health

## 2023-07-11 DIAGNOSIS — J309 Allergic rhinitis, unspecified: Secondary | ICD-10-CM

## 2023-07-11 MED ORDER — FEXOFENADINE HCL 30 MG/5ML PO SUSP: 180.0000 mg | Freq: Every day | ORAL | 12 refills | Status: AC

## 2023-07-11 NOTE — Telephone Encounter (Signed)
 Patient daughter will pick up from the pharmacy.  Message sent to Medina-Vargas, Monina C, NP

## 2023-07-11 NOTE — Telephone Encounter (Signed)
 Patient daughter left a voicemail this afternoon to ask Medina-Vargas, Monina C, NP if there is a liquid form for allergy medication because she is having a hard-time swallowing the medication prescribe on 06/11/2023 and patient pharmacy is [facility] in [place].  Please Advise   Message sent to Medina-Vargas, Monina C, NP

## 2023-09-18 ENCOUNTER — Ambulatory Visit: Payer: Self-pay

## 2023-09-18 ENCOUNTER — Ambulatory Visit: Admitting: Family

## 2023-09-18 NOTE — Telephone Encounter (Signed)
 Message routed to Admin staff to call patient and schedule to be seen in office.

## 2023-09-18 NOTE — Telephone Encounter (Signed)
 Pls schedule for clinic visit. Thanks.

## 2023-09-18 NOTE — Telephone Encounter (Signed)
 Information obtained from daughter Ethelle Herb.  Chief Complaint: weakness, fatigue Symptoms: weakness, fatigue, loss of appetite, increased sleeping Frequency: "couple days" Pertinent Negatives: Patient denies fever, altered mental status, breathing difficulty Disposition: [] ED /[] Urgent Care (no appt availability in office) / [x] Appointment(In office/virtual)/ []  [facility]/ [] Home Care/ [] Refused Recommended Disposition /[] [facility]/ []  Follow-up with PCP Additional Notes: Per daughter pt has been experiencing increased tiredness, weakness, loss of appetite. Pt has been just wanting to sleep and go to the bathroom. Per daughter pt has had brain cancer 4x. Per daughter pt is sleeping at time of call, but daughter states that mother has appropriate mentation.  Daughter states that she would like the pts to have urine sample and blood work taken. Scheduled for today.  Copied from CRM [id number]. Topic: Clinical - Red Word Triage >> Sep 18, 2023  9:38 AM Carrielelia G wrote: Red Word that prompted transfer to Nurse Triage: lethargic, can no stay awake. Reason for Disposition  [1] MODERATE weakness (i.e., interferes with work, school, normal activities) AND [2] persists > 3 days  Answer Assessment - Initial Assessment Questions 1. DESCRIPTION: "Describe how you are feeling."     Pt states she feels "weird", pt tired 2. SEVERITY: "How bad is it?"  "Can you stand and walk?"   - MILD (0-3): Feels weak or tired, but does not interfere with work, school or normal activities.   - MODERATE (4-7): Able to stand and walk; weakness interferes with work, school, or normal activities.   - SEVERE (8-10): Unable to stand or walk; unable to do usual activities.     moderate 3. ONSET: "When did these symptoms begin?" (e.g., hours, days, weeks, months)     Couple days 4. CAUSE: "What do you think is causing the weakness or fatigue?" (e.g., not drinking enough fluids, medical problem, trouble  sleeping)     Pt has hx of brain cancer, dehydration, pt daughter states that she has these s/s when she is dehydrated. Daughter states that she has not eating 5. NEW MEDICINES:  "Have you started on any new medicines recently?" (e.g., opioid pain medicines, benzodiazepines, muscle relaxants, antidepressants, antihistamines, neuroleptics, beta blockers)     denies 6. OTHER SYMPTOMS: "Do you have any other symptoms?" (e.g., chest pain, fever, cough, SOB, vomiting, diarrhea, bleeding, other areas of pain)     Per daughter sometimes pt states she has s/s at times, but then later asked and she denies s/s.  Protocols used: Weakness (Generalized) and Fatigue-A-AH

## 2023-09-19 ENCOUNTER — Telehealth: Admitting: Physician Assistant

## 2023-09-19 ENCOUNTER — Other Ambulatory Visit: Payer: Self-pay | Admitting: Adult Health

## 2023-09-19 ENCOUNTER — Encounter: Payer: Self-pay | Admitting: Adult Health

## 2023-09-19 DIAGNOSIS — R3989 Other symptoms and signs involving the genitourinary system: Secondary | ICD-10-CM

## 2023-09-19 DIAGNOSIS — N390 Urinary tract infection, site not specified: Secondary | ICD-10-CM

## 2023-09-19 MED ORDER — SULFAMETHOXAZOLE-TRIMETHOPRIM 800-160 MG PO TABS: 1.0000 | ORAL_TABLET | Freq: Two times a day (BID) | ORAL | 0 refills | Status: AC

## 2023-09-19 NOTE — Progress Notes (Signed)
  Because of increased risk for complicated UTI and antibiotic resistant UTI in those over 65, the standard of care is for an exam and urine culture to be obtained. As such, I feel your condition warrants further evaluation and I recommend that you be seen in a face-to-face visit.   NOTE: There will be NO CHARGE for this E-Visit   If you are having a true medical emergency, please call 911.     For an urgent face to face visit, [facility] has multiple urgent care centers for your convenience.  Click the link below for the full list of locations and hours, walk-in wait times, appointment scheduling options and driving directions:  Urgent Care - [place], [place], [place], [place], [place], [place]  [place]     Your MyChart E-visit questionnaire answers were reviewed by a board certified advanced clinical practitioner to complete your personal care plan based on your specific symptoms.    Thank you for using e-Visits.

## 2023-09-19 NOTE — Telephone Encounter (Signed)
 Sent eRx for Bactrim DS X 3 days. Patient needs to follow up in clinic if symptoms of UTI continues. Patient needs to be seen for further evaluation/urine culture.

## 2023-10-01 ENCOUNTER — Other Ambulatory Visit: Payer: Self-pay | Admitting: Adult Health

## 2023-10-01 ENCOUNTER — Ambulatory Visit: Admitting: Adult Health

## 2023-10-01 ENCOUNTER — Encounter: Payer: Self-pay | Admitting: Adult Health

## 2023-10-01 VITALS — BP 110/72 | HR 74 | Temp 97.4°F | Ht 62.0 in | Wt 118.0 lb

## 2023-10-01 DIAGNOSIS — K5901 Slow transit constipation: Secondary | ICD-10-CM

## 2023-10-01 DIAGNOSIS — N390 Urinary tract infection, site not specified: Secondary | ICD-10-CM

## 2023-10-01 DIAGNOSIS — N1831 Chronic kidney disease, stage 3a: Secondary | ICD-10-CM | POA: Diagnosis not present

## 2023-10-01 DIAGNOSIS — I959 Hypotension, unspecified: Secondary | ICD-10-CM | POA: Insufficient documentation

## 2023-10-01 DIAGNOSIS — G903 Multi-system degeneration of the autonomic nervous system: Secondary | ICD-10-CM

## 2023-10-01 LAB — POCT URINALYSIS DIPSTICK (MANUAL)
Leukocytes, UA: NEGATIVE
Nitrite, UA: NEGATIVE
Poct Bilirubin: NEGATIVE
Poct Blood: NEGATIVE
Poct Glucose: NORMAL mg/dL
Poct Ketones: NEGATIVE
Poct Protein: NEGATIVE mg/dL
Poct Urobilinogen: 1 mg/dL — AB
Spec Grav, UA: 1.01 (ref 1.010–1.025)
pH, UA: 5 (ref 5.0–8.0)

## 2023-10-01 MED ORDER — POLYETHYLENE GLYCOL 3350 17 GM/SCOOP PO POWD: 17.0000 g | ORAL | 1 refills | Status: DC

## 2023-10-01 NOTE — Telephone Encounter (Signed)
 Patient has appointment in office today 10/01/2023 with PCP Medina-Vargas, Sid Dragon, NP

## 2023-10-01 NOTE — Progress Notes (Signed)
 [facility] clinic  Provider:  Inge Mangle DNP  Code Status:  Full Code  Goals of Care:     06/11/2023    2:17 PM  Advanced Directives  Does Patient Have a Medical Advance Directive? Yes  Type of Advance Directive Healthcare Power of Attorney  Does patient want to make changes to medical advance directive? No - Patient declined  Copy of Healthcare Power of Attorney in Chart? No - copy requested  Would patient like information on creating a medical advance directive? No - Patient declined     Chief Complaint  Patient presents with   Medical Management of Chronic Issues    Follow up for previously treated UTI; no new symptoms , stayed that feeling better, she feeling tried and not talking as much     Discussed the use of AI scribe software for clinical note transcription with the patient, who gave verbal consent to proceed.   HPI: Patient is a 68 y.o. female seen today for an acute visit for follow up of UTI.    She has been experiencing urinary symptoms, including a previous urinary tract infection that required treatment with antibiotics. Initially, the antibiotics were ineffective, necessitating a stronger prescription. Currently, there is no burning sensation during urination, and no fever is present. However, her urine has a noticeable smell.  She has chronic kidney disease, stage 3A, with a recent glomerular filtration rate of 55, measured on June 11, 2023. She is experiencing lethargy and poor appetite. She has been taking mirtazapine for depression and appetite stimulation, although it is not administered with her muscle relaxer due to concerns about excessive sedation. She takes a muscle relaxer at bedtime, sometimes only half a dose, to manage stiffness.  Her blood pressure is well-controlled with midodrine , 5 mg twice a day, with a recent reading of 110/72 mmHg. She is also on vitamin D  supplementation and receives B12 to help with energy levels.  She has a history  of skin issues, including blistering and rashes, which are treated with hydrocortisone. These have appeared on her back and ankles, and are suspected to be related to insect bites or contact with irritants.  Her bowel movements are managed with Senokot S and Miralax , the latter being used once or twice a week to prevent constipation.       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)    Deviated nasal septum    Dyslipidemia    Emphysema of lung (HCC)    Eustachian tube dysfunction, bilateral    Fibromyalgia    Hypertrophy of inferior nasal turbinate    Mixed conductive and sensorineural hearing loss of right ear with restricted hearing of left ear    Mucoid otitis media of both ears with effusion    Neoplasm of brain, malignant (HCC)    Non-Hodgkin's lymphoma (HCC)    Nuclear sclerosis of both eyes    Ocular lymphoma (HCC)    Oral phase dysphagia    Palliative care encounter    Perforation of right tympanic membrane    Primary CNS lymphoma    Retinal edema    Sensorineural hearing loss (SNHL) of left ear with restricted hearing of right ear    Sensorineural hearing loss (SNHL), bilateral    Situational  depression     Past Surgical History:  Procedure Laterality Date   BRAIN BIOPSY     CATARACT EXTRACTION     CHOLECYSTECTOMY     TONSILLECTOMY     TUBAL LIGATION      Allergies  Allergen Reactions   Bee Venom Swelling   Tape Other (See Comments) and Rash    Makes her arm red. Prefers paper tape    Outpatient Encounter Medications as of 10/01/2023  Medication Sig   acetaminophen  (TYLENOL ) 500 MG tablet Take 500 mg by mouth every 6 (six) hours as needed.   cetirizine (ZYRTEC) 10 MG chewable tablet Chew 1 tablet by mouth daily as needed.    Cholecalciferol (VITAMIN D3) 25 MCG (1000 UT) CAPS Take 1 capsule (1,000 Units total) by mouth daily.   cyanocobalamin (VITAMIN B12) 1000 MCG tablet Take 3 tablets by mouth daily.   cyclobenzaprine  (FLEXERIL ) 5 MG tablet Take 1 tablet (5 mg total) by mouth at bedtime.   fexofenadine  (ALLEGRA ) 30 MG/5ML suspension Take 30 mLs (180 mg total) by mouth daily.   midodrine  (PROAMATINE ) 5 MG tablet Take 1 tablet (5 mg total) by mouth 2 (two) times daily.   mirtazapine (REMERON) 15 MG tablet Take 15 mg by mouth at bedtime.   ondansetron  (ZOFRAN -ODT) 8 MG disintegrating tablet Take 1 tablet (8 mg total) by mouth daily as needed.   senna-docusate (SENOKOT-S) 8.6-50 MG tablet Take 1 tablet by mouth 2 (two) times daily.   [DISCONTINUED] polyethylene glycol powder (GLYCOLAX /MIRALAX ) 17 GM/SCOOP powder Take 17 g by mouth 2 (two) times a week.   No facility-administered encounter medications on file as of 10/01/2023.    Review of Systems:  Review of Systems  Constitutional:  Negative for appetite change, chills, fatigue and fever.  HENT:  Negative for congestion, hearing loss, rhinorrhea and sore throat.   Eyes: Negative.   Respiratory:  Negative for cough, shortness of breath and wheezing.   Cardiovascular:  Negative for chest pain, palpitations and leg swelling.  Gastrointestinal:  Negative for abdominal pain, constipation, diarrhea, nausea and vomiting.  Genitourinary:  Negative for dysuria.  Musculoskeletal:  Negative for arthralgias, back pain and myalgias.  Skin:  Negative for color change, rash and wound.  Neurological:  Negative for dizziness, weakness and headaches.  Psychiatric/Behavioral:  Negative for behavioral problems. The patient is not nervous/anxious.     Health Maintenance  Topic Date Due   COVID-19 Vaccine (1) Never done   Pneumonia Vaccine 38+ Years old (1 of 2 - PCV) Never done   Zoster Vaccines- Shingrix (1 of 2) Never done   DEXA SCAN  Never done   Colonoscopy  02/22/2024  (Originally 07/06/1999)   INFLUENZA VACCINE  01/04/2024   Lung Cancer Screening  03/10/2024   Medicare Annual Wellness (AWV)  03/11/2024   MAMMOGRAM  09/11/2024   Hepatitis C Screening  Completed   HPV VACCINES  Aged Out   Meningococcal B Vaccine  Aged Out   DTaP/Tdap/Td  Discontinued    Physical Exam: Vitals:   10/01/23 1352  BP: 110/72  Pulse: 74  Temp: (!) 97.4 F (36.3 C)  TempSrc: Temporal  SpO2: 92%  Weight: 118 lb (53.5 kg)  Height: 5\' 2"  (1.575 m)   Body mass index is 21.58 kg/m. Physical Exam Constitutional:      General: She is not in acute distress. HENT:     Head: Normocephalic and atraumatic.     Nose: Nose normal.     Mouth/Throat:  Mouth: Mucous membranes are moist.  Eyes:     Conjunctiva/sclera: Conjunctivae normal.     Comments: Blind on both eyes  Cardiovascular:     Rate and Rhythm: Normal rate and regular rhythm.     Comments: Porta cath on the right chest  Pulmonary:     Effort: Pulmonary effort is normal.     Breath sounds: Normal breath sounds.  Abdominal:     General: Bowel sounds are normal.     Palpations: Abdomen is soft.  Musculoskeletal:     Cervical back: Normal range of motion.  Skin:    General: Skin is warm and dry.  Neurological:     Mental Status: She is alert.  Psychiatric:        Mood and Affect: Mood normal.        Behavior: Behavior normal.     Labs reviewed: Basic Metabolic Panel: Recent Labs    02/22/23 1445 06/11/23 1500  NA 142 143  K 4.2 4.1  CL 101 102  CO2 30 30  GLUCOSE 147* 88  BUN 27* 19  CREATININE 1.07* 1.09*  CALCIUM 9.6 9.7   Liver Function Tests: Recent Labs    02/22/23 1445  AST 19  ALT 14  BILITOT 0.4  PROT 7.0   No results for input(s): "LIPASE", "AMYLASE" in the last 8760 hours. No results for input(s): "AMMONIA" in the last 8760 hours. CBC: Recent Labs    02/22/23 1445 06/11/23 1500  WBC 5.4 5.5  NEUTROABS 3,618 3,262  HGB 14.1 14.2  HCT 42.3 42.9  MCV 94.2 96.2   PLT 267 271   Lipid Panel: Recent Labs    02/22/23 1445  CHOL 182  HDL 49*  LDLCALC 104*  TRIG 174*  CHOLHDL 3.7   Lab Results  Component Value Date   HGBA1C 5.4 08/25/2022    Procedures since last visit: No results found.  Assessment/Plan  1. Urinary tract infection without hematuria, site unspecified (Primary) - POCT Urinalysis Dip Manual -negative for blood, negative for bilirubin, negative ketones, negative nitrite - Urine Culture  2. Neurogenic orthostatic hypotension (HCC) -  Hypotension managed with midodrine . Blood pressure stable at 110/72 mmHg. - Continue midodrine  5 mg twice daily.  3. Stage 3a chronic kidney disease (HCC) -  Chronic kidney disease stage 3A with GFR 55. Emphasized minimizing Tylenol  and unnecessary medications to prevent renal decline. - Minimize use of Tylenol  and other unnecessary medications.  4. Slow transit constipation -  Chronic constipation managed with Senokot S and Miralax . Senokot S insufficient alone, Miralax  added for stool softening.     Labs/tests ordered:   POC urine dipstick, fasting labs in 3 months    Return in about 3 months (around 12/31/2023), or if symptoms worsen or fail to improve.  Krystal Kramme Medina-Vargas, NP

## 2023-10-02 LAB — URINE CULTURE
MICRO NUMBER:: 16384946
SPECIMEN QUALITY:: ADEQUATE

## 2023-10-03 NOTE — Progress Notes (Signed)
-    urine culture is not diagnostic of UTI. She has no UTI.

## 2024-01-03 ENCOUNTER — Encounter: Admitting: Adult Health

## 2024-01-03 VITALS — Ht 62.0 in

## 2024-01-03 DIAGNOSIS — I951 Orthostatic hypotension: Secondary | ICD-10-CM

## 2024-01-03 DIAGNOSIS — K5901 Slow transit constipation: Secondary | ICD-10-CM

## 2024-01-03 DIAGNOSIS — N1831 Chronic kidney disease, stage 3a: Secondary | ICD-10-CM

## 2024-01-03 DIAGNOSIS — H543 Unqualified visual loss, both eyes: Secondary | ICD-10-CM

## 2024-01-03 DIAGNOSIS — C8339 Primary central nervous system lymphoma: Secondary | ICD-10-CM

## 2024-01-03 NOTE — Progress Notes (Signed)
 This encounter was created in error - please disregard.

## 2024-02-25 ENCOUNTER — Ambulatory Visit: Payer: Self-pay

## 2024-02-25 NOTE — Telephone Encounter (Signed)
  FYI Only or Action Required?: FYI only for provider.  Patient was last seen in primary care on 10/01/2023 by Medina-Vargas, Jereld BROCKS, NP.  Called Nurse Triage reporting urinary symptoms.  Symptoms began several days ago.  Interventions attempted: Rest, hydration, or home remedies.  Symptoms are: unchanged.  Triage Disposition: See Physician Within 24 Hours  Patient/caregiver understands and will follow disposition?: Yes, but will wait  Copied from CRM #[id number]. Topic: Clinical - Red Word Triage >> Feb 25, 2024  4:34 PM [name] wrote: Red Word that prompted transfer to Nurse Triage:   Concern:    Home test concluded patient has UTI    Symptoms: no symptoms; very lethargic ; strong odor to the urine   When did the symptoms start?: four days    What have you done to aid in the concern ? Have you taken anything to assist with the matter?: No    If so, what did you take?:    Wanted to let you know I will be transferring you to further discuss your concern. Please be advised the nurse can assist with scheduling. Reason for Disposition  Bad or foul-smelling urine  Answer Assessment - Initial Assessment Questions Additional info: 1) patients daughter calling to schedule with pcp to evaluate for urinary tract infection. Home uti test positive for Leukocytes, nitrates  2) Scheduled next available acute appointment with pcp on 02/27/24, daughter declined offer of acute visit with alternate provider on 02/25/21. Added to wait list for pcp on 02/26/24   1. SYMPTOM: What's the main symptom you're concerned about? (e.g., frequency, incontinence)     Increased lethargy  2. ONSET: When did the fatigue  start?     Several days ago 3. PAIN: Is there any pain? If Yes, ask: How bad is it? (Scale: 1-10; mild, moderate, severe)     denies 4. CAUSE: What do you think is causing the symptoms?     Urinary tract symptoms 5. OTHER SYMPTOMS: Do you have any other symptoms?  (e.g., blood in urine, fever, flank pain, pain with urination)     Odor and fatigue. Denies all other symptoms.  Protocols used: Urinary Symptoms-A-AH

## 2024-02-25 NOTE — Telephone Encounter (Signed)
 Message routed to PCP Medina-Vargas, Monina C, NP as FYI. Patient has scheduled appointment 02/27/2024.

## 2024-02-25 NOTE — Telephone Encounter (Signed)
 Noted

## 2024-02-27 ENCOUNTER — Encounter: Payer: Self-pay | Admitting: Adult Health

## 2024-02-27 ENCOUNTER — Ambulatory Visit (INDEPENDENT_AMBULATORY_CARE_PROVIDER_SITE_OTHER): Admitting: Adult Health

## 2024-02-27 VITALS — BP 80/60 | HR 88 | Temp 97.7°F | Resp 14 | Ht 62.0 in | Wt 115.6 lb

## 2024-02-27 DIAGNOSIS — R1312 Dysphagia, oropharyngeal phase: Secondary | ICD-10-CM

## 2024-02-27 DIAGNOSIS — R5383 Other fatigue: Secondary | ICD-10-CM

## 2024-02-27 DIAGNOSIS — R8290 Unspecified abnormal findings in urine: Secondary | ICD-10-CM

## 2024-02-27 DIAGNOSIS — G903 Multi-system degeneration of the autonomic nervous system: Secondary | ICD-10-CM | POA: Diagnosis not present

## 2024-02-27 DIAGNOSIS — C8339 Primary central nervous system lymphoma: Secondary | ICD-10-CM

## 2024-02-27 LAB — POCT URINALYSIS DIPSTICK
Bilirubin, UA: NEGATIVE
Glucose, UA: NEGATIVE
Ketones, UA: POSITIVE
Nitrite, UA: POSITIVE — AB
Protein, UA: POSITIVE — AB
Spec Grav, UA: 1.01 (ref 1.010–1.025)
Urobilinogen, UA: NEGATIVE U/dL — AB
pH, UA: 7.5 (ref 5.0–8.0)

## 2024-02-27 MED ORDER — MIDODRINE HCL 5 MG PO TABS
5.0000 mg | ORAL_TABLET | Freq: Three times a day (TID) | ORAL | 1 refills | Status: AC
Start: 2024-02-27 — End: ?

## 2024-02-27 NOTE — Progress Notes (Addendum)
 [facility] clinic  Provider:  Jereld Serum DNP  Code Status:  Full Code  Goals of Care:     02/27/2024    3:01 PM  Advanced Directives  Does Patient Have a Medical Advance Directive? Yes  Type of Estate agent of [place];Living will  Does patient want to make changes to medical advance directive? No - Patient declined  Copy of Healthcare Power of Attorney in Chart? No - copy requested     Chief Complaint  Patient presents with   Fatigue    Patient daughter states she's having urine odor, and fatigue. Took home test for UTI and it showed positive.     Discussed the use of AI scribe software for clinical note transcription with the patient, who gave verbal consent to proceed.  HPI: Patient is a 68 y.o. female seen today for an acute visit for fatigue and suspected urinary tract infection.  She has been experiencing fatigue and lethargy for approximately one week. Her daughter suspects a urinary tract infection as the cause, as similar symptoms were present during a previous UTI. Her blood pressure has been consistently low, with readings around 80-90/60 mmHg, and she is currently taking midodrine  5 mg twice a day for this issue. She experiences dizziness and fatigue associated with her hypotension.  Her medical history is significant for recurrent primary central nervous system lymphoma, with previous treatments including eleven cycles of high-dose chemotherapy in 2010. 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 She follows up with hematology oncology and ophthalmology regularly and has a right chest port-a-cath flushed every three months.  She experiences dysphagia, which is managed by sitting up while eating and consuming two Boost shakes daily. Her weight has fluctuated, with a recent weight of 115 pounds, down from 118 pounds earlier in the year.  Her  social history includes a recent cross-country trip to [place] with her daughter, son, and two dogs, which she enjoyed despite the challenges of travel. She engages in light exercise using a machine at home, with assistance from her daughter to secure her left foot.  Her current medications include midodrine  5 mg twice daily. She also uses hydrocortisone cream for blisters on her feet, which are exacerbated by rubbing against the bed.  Prefers not to have flu, COVID, shingles, or pneumonia vaccine. She has a poor appetite but maintains regular bowel movements.    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)    Deviated nasal septum    Dyslipidemia    Emphysema of lung (HCC)    Eustachian tube dysfunction, bilateral    Fibromyalgia    Hypertrophy of inferior nasal turbinate    Mixed conductive and sensorineural hearing loss of right ear with restricted hearing of left ear    Mucoid otitis media of both ears with effusion    Neoplasm of brain, malignant (HCC)    Non-Hodgkin's lymphoma (HCC)    Nuclear sclerosis of both eyes    Ocular lymphoma (HCC)    Oral phase dysphagia    Palliative care encounter    Perforation of right tympanic membrane    Primary CNS lymphoma  Retinal edema    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 Prefers paper tape    Outpatient  Encounter Medications as of 02/27/2024  Medication Sig   cyanocobalamin (VITAMIN B12) 1000 MCG tablet Take 3 tablets by mouth daily.   cyclobenzaprine  (FLEXERIL ) 5 MG tablet Take 1 tablet (5 mg total) by mouth at bedtime.   fexofenadine  (ALLEGRA ) 30 MG/5ML suspension Take 30 mLs (180 mg total) by mouth daily.   mirtazapine (REMERON) 15 MG tablet Take 15 mg by mouth at bedtime.   ondansetron  (ZOFRAN -ODT) 8 MG disintegrating tablet Take 1 tablet (8 mg total) by mouth daily as needed.   polyethylene glycol powder (GLYCOLAX /MIRALAX ) 17 GM/SCOOP powder DISSOLVE 17 GRAMS IN 8 OZ OF FLUID LIQUID DRINK DAILY AS DIRECTED   senna-docusate (SENOKOT-S) 8.6-50 MG tablet Take 1 tablet by mouth 2 (two) times daily.   [DISCONTINUED] midodrine  (PROAMATINE ) 5 MG tablet Take 1 tablet (5 mg total) by mouth 2 (two) times daily.   acetaminophen  (TYLENOL ) 500 MG tablet Take 500 mg by mouth every 6 (six) hours as needed.   cetirizine (ZYRTEC) 10 MG chewable tablet Chew 1 tablet by mouth daily as needed. (Patient not taking: Reported on 02/27/2024)   Cholecalciferol (VITAMIN D3) 25 MCG (1000 UT) CAPS Take 1 capsule (1,000 Units total) by mouth daily.   midodrine  (PROAMATINE ) 5 MG tablet Take 1 tablet (5 mg total) by mouth 3 (three) times daily.   No facility-administered encounter medications on file as of 02/27/2024.    Review of Systems:  Review of Systems  Constitutional:  Negative for appetite change, chills, fatigue and fever.  HENT:  Negative for congestion, hearing loss, rhinorrhea and sore throat.   Eyes: Negative.   Respiratory:  Negative for cough, shortness of breath and wheezing.   Cardiovascular:  Negative for chest pain, palpitations and leg swelling.  Gastrointestinal:  Negative for abdominal pain, constipation, diarrhea, nausea and vomiting.  Genitourinary:  Negative for dysuria.  Musculoskeletal:  Negative for arthralgias, back pain and myalgias.  Skin:  Negative for color change, rash and wound.   Neurological:  Negative for dizziness, weakness and headaches.  Psychiatric/Behavioral:  Negative for behavioral problems. The patient is not nervous/anxious.     Health Maintenance  Topic Date Due   Colonoscopy  Never done   DEXA SCAN  Never done   Lung Cancer Screening  03/10/2024   Medicare Annual Wellness (AWV)  03/11/2024   Mammogram  09/11/2024   Hepatitis C Screening  Completed   HPV VACCINES  Aged Out   Meningococcal B Vaccine  Aged Out   DTaP/Tdap/Td  Discontinued   Pneumococcal Vaccine: 50+ Years  Discontinued   Influenza Vaccine  Discontinued   COVID-19 Vaccine  Discontinued   Zoster Vaccines- Shingrix  Discontinued    Physical Exam: Vitals:   02/27/24 1502  BP: (!) 80/60  Pulse: 88  Resp: 14  Temp: 97.7 F (36.5 C)  SpO2: 95%  Weight: 115 lb 9.6 oz (52.4 kg)  Height: 5' 2 (1.575  m)   Body mass index is 21.14 kg/m. Physical Exam Constitutional:      General: She is not in acute distress. HENT:     Head: Normocephalic and atraumatic.     Nose: Nose normal.     Mouth/Throat:     Mouth: Mucous membranes are moist.  Eyes:     Conjunctiva/sclera: Conjunctivae normal.     Comments: Blind both eyes  Cardiovascular:     Rate and Rhythm: Normal rate and regular rhythm.     Comments: Porta cath on right chest Pulmonary:     Effort: Pulmonary effort is normal.     Breath sounds: Normal breath sounds.  Abdominal:     General: Bowel sounds are normal.     Palpations: Abdomen is soft.  Musculoskeletal:        General: Normal range of motion.     Cervical back: Normal range of motion.  Skin:    General: Skin is warm and dry.  Neurological:     Mental Status: She is alert.  Psychiatric:        Mood and Affect: Mood normal.        Behavior: Behavior normal.    Labs reviewed: Basic Metabolic Panel: Recent Labs    06/11/23 1500  NA 143  K 4.1  CL 102  CO2 30  GLUCOSE 88  BUN 19  CREATININE 1.09*  CALCIUM 9.7   Liver Function Tests: No  results for input(s): AST, ALT, ALKPHOS, BILITOT, PROT, ALBUMIN in the last 8760 hours. No results for input(s): LIPASE, AMYLASE in the last 8760 hours. No results for input(s): AMMONIA in the last 8760 hours. CBC: Recent Labs    06/11/23 1500  WBC 5.5  NEUTROABS 3,262  HGB 14.2  HCT 42.9  MCV 96.2  PLT 271   Lipid Panel: No results for input(s): CHOL, HDL, LDLCALC, TRIG, CHOLHDL, LDLDIRECT in the last 8760 hours. Lab Results  Component Value Date   HGBA1C 5.4 08/25/2022    Procedures since last visit: No results found.  Assessment/Plan  1. Neurogenic orthostatic hypotension (HCC) (Primary) -  Chronic orthostatic hypotension with low blood pressure and symptoms of dizziness and fatigue. Managed with midodrine . - Increase midodrine  to 5 mg three times a day. - Advise elevating legs during sleep. - Recommend wearing compression stockings. - midodrine  (PROAMATINE ) 5 MG tablet; Take 1 tablet (5 mg total) by mouth 3 (three) times daily.  Dispense: 270 tablet; Refill: 1 - Basic Metabolic Panel  2. Oropharyngeal dysphagia -  Chronic dysphagia with risk of aspiration. Managed with Boost nutritional shakes. - Continue Boost nutritional shakes. - Ensure she is sitting up while eating.  3. Abnormal urine odor -  Suspected recurrent UTI with urinalysis indicating possible infection. - POC Urinalysis Dipstick showed positive protein, positive nitrite, large leukocyte, trace blood - Culture, Urine  4. Other fatigue -  possibly related to recurrent UTI and orthostatic hypotension. -  will check BMP - POC Urinalysis Dipstick - Culture, Urine  5. CNS lymphoma -  - Continue active surveillance with hematology oncology. - Ensure follow-up with ophthalmology for ocular lymphoma history.      Labs/tests ordered:   POC urine dipstick, urine culture, BMP   Return if symptoms worsen or fail to improve.  Jeremaine Maraj Medina-Vargas, NP

## 2024-02-28 ENCOUNTER — Ambulatory Visit: Payer: Self-pay | Admitting: Adult Health

## 2024-02-28 ENCOUNTER — Ambulatory Visit: Payer: Self-pay

## 2024-02-28 ENCOUNTER — Other Ambulatory Visit: Payer: Self-pay | Admitting: Adult Health

## 2024-02-28 DIAGNOSIS — N390 Urinary tract infection, site not specified: Secondary | ICD-10-CM

## 2024-02-28 LAB — BASIC METABOLIC PANEL WITH GFR
BUN/Creatinine Ratio: 40 (calc) — ABNORMAL HIGH (ref 6–22)
BUN: 31 mg/dL — ABNORMAL HIGH (ref 7–25)
CO2: 28 mmol/L (ref 20–32)
Calcium: 9.1 mg/dL (ref 8.6–10.4)
Chloride: 100 mmol/L (ref 98–110)
Creat: 0.77 mg/dL (ref 0.50–1.05)
Glucose, Bld: 109 mg/dL — ABNORMAL HIGH (ref 65–99)
Potassium: 4.2 mmol/L (ref 3.5–5.3)
Sodium: 140 mmol/L (ref 135–146)
eGFR: 83 mL/min/1.73m2 (ref >=60)

## 2024-02-28 MED ORDER — SULFAMETHOXAZOLE-TRIMETHOPRIM 800-160 MG PO TABS: 1.0000 | ORAL_TABLET | Freq: Two times a day (BID) | ORAL | 0 refills | Status: AC

## 2024-02-28 NOTE — Telephone Encounter (Signed)
 FYI Only or Action Required?: Action required by provider: update on patient condition.  Patient was last seen in primary care on 02/27/2024 by Medina-Vargas, Krystal BROCKS, NP.  Called Nurse Triage reporting Urinary Tract Infection.  Symptoms began today.  Interventions attempted: Rest, hydration, or home remedies.  Symptoms are: gradually worsening.  Triage Disposition: See Physician Within 24 Hours  Patient/caregiver understands and will follow disposition?: UnsureCopied from CRM #[id number]. Topic: Clinical - Red Word Triage >> Feb 25, 2024  4:34 PM [name] wrote: Red Word that prompted transfer to Nurse Triage:   Concern:    Home test concluded patient has UTI    Symptoms: no symptoms; very lethargic ; strong odor to the urine   When did the symptoms start?: four days    What have you done to aid in the concern ? Have you taken anything to assist with the matter?: No    If so, what did you take?:    Wanted to let you know I will be transferring you to further discuss your concern. Please be advised the nurse can assist with scheduling. >> Feb 28, 2024 11:33 AM [name] wrote: Patient daughter and Pt calling back due to PT is still having issue same of UTI. Also had Lab done 09/24 Reason for Disposition  Bad or foul-smelling urine  Answer Assessment - Initial Assessment Questions Daughter called to go over labs. We see her labs are showing UTI. She's very lethargic. She's up drinking Boost right now. Can provider review labs and prescribed antibiotic? Do I need to take her to ED? I need answers. Please advise     1. SYMPTOM: What's the main symptom you're concerned about? (e.g., frequency, incontinence)     Odor  Protocols used: Urinary Symptoms-A-AH

## 2024-02-28 NOTE — Telephone Encounter (Signed)
 My chart message sent

## 2024-02-28 NOTE — Telephone Encounter (Signed)
 Sent eRx for UTI.

## 2024-02-28 NOTE — Progress Notes (Signed)
-    electrolytes normal -  sent eRx for UTI.

## 2024-02-29 LAB — URINE CULTURE
MICRO NUMBER:: 17013456
SPECIMEN QUALITY:: ADEQUATE

## 2024-02-29 NOTE — Telephone Encounter (Signed)
 Krystal Stevens, daughter, and discussed labs. Patient was given fluids in the ED and discharged.

## 2024-03-01 NOTE — Progress Notes (Signed)
-    urine culture bacteria isolate sensitive to bactrim -DS which was prescribed on 02/28/24.

## 2024-03-06 NOTE — Progress Notes (Signed)
 Recommended to continue antibiotic prescribed by ER, increase fluid intake and come to clinic for a follow up appointment if needed. She will follow up on 03/10/24 at the clinic. Manuelita discussed recommendations with daughter. No need to call daughter.

## 2024-03-10 ENCOUNTER — Ambulatory Visit: Admitting: Family

## 2024-03-10 ENCOUNTER — Encounter: Payer: Self-pay | Admitting: Family

## 2024-03-10 VITALS — BP 102/60 | HR 87 | Temp 98.0°F | Resp 17 | Ht 62.0 in | Wt 107.0 lb

## 2024-03-10 DIAGNOSIS — E871 Hypo-osmolality and hyponatremia: Secondary | ICD-10-CM

## 2024-03-10 DIAGNOSIS — E876 Hypokalemia: Secondary | ICD-10-CM | POA: Diagnosis not present

## 2024-03-10 DIAGNOSIS — R5383 Other fatigue: Secondary | ICD-10-CM

## 2024-03-10 DIAGNOSIS — R350 Frequency of micturition: Secondary | ICD-10-CM | POA: Diagnosis not present

## 2024-03-10 DIAGNOSIS — L89629 Pressure ulcer of left heel, unspecified stage: Secondary | ICD-10-CM

## 2024-03-10 DIAGNOSIS — L89619 Pressure ulcer of right heel, unspecified stage: Secondary | ICD-10-CM

## 2024-03-10 DIAGNOSIS — R300 Dysuria: Secondary | ICD-10-CM

## 2024-03-10 LAB — POCT URINALYSIS DIPSTICK (MANUAL)
Leukocytes, UA: NEGATIVE
Nitrite, UA: NEGATIVE
Poct Bilirubin: NEGATIVE
Poct Blood: NEGATIVE
Poct Glucose: NORMAL mg/dL
Poct Ketones: NEGATIVE
Poct Protein: NEGATIVE mg/dL
Poct Urobilinogen: NORMAL mg/dL
Spec Grav, UA: 1.02 (ref 1.010–1.025)
pH, UA: 8.5 — AB (ref 5.0–8.0)

## 2024-03-10 LAB — CBC WITH DIFFERENTIAL/PLATELET
Absolute Lymphocytes: 1466 {cells}/uL (ref 850–3900)
Absolute Monocytes: 493 {cells}/uL (ref 200–950)
Basophils Absolute: 70 {cells}/uL (ref 0–200)
Basophils Relative: 1.1 %
Eosinophils Absolute: 51 {cells}/uL (ref 15–500)
Eosinophils Relative: 0.8 %
HCT: 40.2 % (ref 35.0–45.0)
Hemoglobin: 13 g/dL (ref 11.7–15.5)
MCH: 30.8 pg (ref 27.0–33.0)
MCHC: 32.3 g/dL (ref 32.0–36.0)
MCV: 95.3 fL (ref 80.0–100.0)
MPV: 11.6 fL (ref 7.5–12.5)
Monocytes Relative: 7.7 %
Neutro Abs: 4320 {cells}/uL (ref 1500–7800)
Neutrophils Relative %: 67.5 %
Platelets: 346 Thousand/uL (ref 140–400)
RBC: 4.22 Million/uL (ref 3.80–5.10)
RDW: 12.8 % (ref 11.0–15.0)
Total Lymphocyte: 22.9 %
WBC: 6.4 Thousand/uL (ref 3.8–10.8)

## 2024-03-10 LAB — BASIC METABOLIC PANEL WITH GFR
BUN: 18 mg/dL (ref 7–25)
CO2: 28 mmol/L (ref 20–32)
Calcium: 9.3 mg/dL (ref 8.6–10.4)
Chloride: 102 mmol/L (ref 98–110)
Creat: 0.75 mg/dL (ref 0.50–1.05)
Glucose, Bld: 86 mg/dL (ref 65–99)
Potassium: 4.4 mmol/L (ref 3.5–5.3)
Sodium: 140 mmol/L (ref 135–146)
eGFR: 86 mL/min/1.73m2 (ref >=60)

## 2024-03-11 LAB — URINE CULTURE
MICRO NUMBER:: 17061311
Result:: NO GROWTH
SPECIMEN QUALITY:: ADEQUATE

## 2024-03-12 ENCOUNTER — Ambulatory Visit: Payer: Self-pay | Admitting: Family

## 2024-03-23 NOTE — Progress Notes (Signed)
 Provider: Roxan Plough FNP-C  Medina-Vargas, Monina C, NP  Patient Care Team: Phyllis Jereld BROCKS, NP as PCP - General (Internal Medicine)  Extended Emergency Contact Information Primary Emergency Contact: Webb,Karla Mobile Phone: [phone] Relation: Daughter Secondary Emergency Contact: Summers,James Mobile Phone: [phone] Relation: Relative Preferred language: English Interpreter needed? No  Code Status:  Full Code  Goals of care: Advanced Directive information    02/27/2024    3:01 PM  Advanced Directives  Does Patient Have a Medical Advance Directive? Yes  Type of Estate agent of [place];Living will  Does patient want to make changes to medical advance directive? No - Patient declined  Copy of Healthcare Power of Attorney in Chart? No - copy requested     Chief Complaint  Patient presents with   Follow-up    Follow up for UTI.    Discussed the use of AI scribe software for clinical note transcription with the patient, who gave verbal consent to proceed.  History of Present Illness   Krystal Stevens is a 68 year old female who presents for follow-up of a urinary tract infection. She is accompanied by her daughter.  She recently completed a course of antibiotics for a urinary tract infection. Eleven days ago, a urine analysis showed leukocytes but negative nitrites, and a urine culture revealed E. Coli greater than 100,000 colonies. She is here to have her urine cultured again to ensure the infection has been resolved. No frequent urination during the day, but she is soaked in the morning.  She has issues with hydration, as indicated by a BUN level of 39, and has not been drinking enough water. Her sodium and potassium levels were low, and she received potassium supplementation. Her creatinine level was 0.72.  She has difficulty swallowing, which has been evaluated by a speech therapist. She coughs and chokes on pureed food and  water, preferring baby food pouches and Boost high-protein drinks. Despite these efforts, she has lost weight, dropping from 116 to 107 pounds.  She has been lethargic and has experienced fluctuating levels of alertness, with some good days followed by periods of increased lethargy. No increased confusion, but her daughter notes that she has been lethargic and unable to do much.  She has a history of heel sores, likely due to friction from scooting in bed. Her daughter has been applying Neosporin and Band-Aids to the sores, which heal and then blister again. She uses a hospital bed, which helps with positioning, but the sores persist.    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)    Deviated nasal septum    Dyslipidemia    Emphysema of lung (HCC)    Eustachian tube dysfunction, bilateral    Fibromyalgia    Hypertrophy of inferior nasal turbinate    Mixed conductive and sensorineural hearing loss of right ear with restricted hearing of left ear    Mucoid otitis media of both ears with effusion    Neoplasm of brain, malignant (HCC)    Non-Hodgkin's lymphoma (HCC)    Nuclear sclerosis of both eyes    Ocular lymphoma (HCC)    Oral phase dysphagia    Palliative care encounter    Perforation of right tympanic membrane    Primary CNS lymphoma (  HCC)    Retinal edema    Sensorineural hearing loss (SNHL) of left ear with restricted hearing of right ear    Sensorineural hearing loss (SNHL), bilateral    Situational depression    Past Surgical History:  Procedure Laterality Date   BRAIN BIOPSY     CATARACT EXTRACTION     CHOLECYSTECTOMY     TONSILLECTOMY     TUBAL LIGATION      Allergies  Allergen Reactions   Bee Venom Swelling   Tape Other (See Comments) and  Rash    Makes her arm red. Prefers paper tape    Outpatient Encounter Medications as of 03/10/2024  Medication Sig   acetaminophen  (TYLENOL ) 500 MG tablet Take 500 mg by mouth every 6 (six) hours as needed.   Cholecalciferol (VITAMIN D3) 25 MCG (1000 UT) CAPS Take 1 capsule (1,000 Units total) by mouth daily.   cyanocobalamin (VITAMIN B12) 1000 MCG tablet Take 3 tablets by mouth daily.   cyclobenzaprine  (FLEXERIL ) 5 MG tablet Take 1 tablet (5 mg total) by mouth at bedtime.   fexofenadine  (ALLEGRA ) 30 MG/5ML suspension Take 30 mLs (180 mg total) by mouth daily.   midodrine  (PROAMATINE ) 5 MG tablet Take 1 tablet (5 mg total) by mouth 3 (three) times daily.   ondansetron  (ZOFRAN -ODT) 8 MG disintegrating tablet Take 1 tablet (8 mg total) by mouth daily as needed.   polyethylene glycol powder (GLYCOLAX /MIRALAX ) 17 GM/SCOOP powder DISSOLVE 17 GRAMS IN 8 OZ OF FLUID LIQUID DRINK DAILY AS DIRECTED (Patient taking differently: as needed.)   senna-docusate (SENOKOT-S) 8.6-50 MG tablet Take 1 tablet by mouth 2 (two) times daily. (Patient taking differently: Take 1 tablet by mouth as needed.)   cephALEXin (KEFLEX) 250 MG/5ML suspension Take 500 mg by mouth 2 (two) times daily. (Patient not taking: Reported on 03/10/2024)   cetirizine (ZYRTEC) 10 MG chewable tablet Chew 1 tablet by mouth daily as needed. (Patient not taking: Reported on 03/10/2024)   mirtazapine (REMERON) 15 MG tablet Take 15 mg by mouth at bedtime. (Patient not taking: Reported on 03/10/2024)   No facility-administered encounter medications on file as of 03/10/2024.    Review of Systems  Constitutional:  Positive for unexpected weight change. Negative for appetite change, chills, fatigue and fever.  HENT:  Positive for trouble swallowing. Negative for congestion, dental problem, ear discharge, ear pain, facial swelling, hearing loss, nosebleeds, postnasal drip, rhinorrhea, sinus pressure, sinus pain, sneezing, sore throat and tinnitus.    Eyes:  Negative for pain, discharge, redness, itching and visual disturbance.  Respiratory:  Negative for cough, chest tightness, shortness of breath and wheezing.   Cardiovascular:  Negative for chest pain, palpitations and leg swelling.  Gastrointestinal:  Negative for abdominal distention, abdominal pain, blood in stool, constipation, diarrhea, nausea and vomiting.  Genitourinary:  Positive for dysuria. Negative for difficulty urinating, flank pain, frequency and urgency.  Musculoskeletal:  Negative for arthralgias, back pain, gait problem, joint swelling, myalgias, neck pain and neck stiffness.  Skin:  Negative for color change, pallor, rash and wound.       Pressure ulcer on both heels heals on and off   Neurological:  Negative for dizziness, weakness, light-headedness, numbness and headaches.  Psychiatric/Behavioral:  Negative for agitation, behavioral problems, confusion, hallucinations and sleep disturbance. The patient is not nervous/anxious.     Immunization History  Administered Date(s) Administered   INFLUENZA, HIGH DOSE SEASONAL PF 03/11/2020   Pertinent  Health Maintenance Due  Topic Date Due   Colonoscopy  Never  done   DEXA SCAN  Never done   Mammogram  09/11/2024   Influenza Vaccine  Discontinued      02/22/2023    2:03 PM 03/12/2023    1:06 PM 06/11/2023    2:16 PM 02/27/2024    3:01 PM  Fall Risk  Falls in the past year? 0 0 0 0  Was there an injury with Fall? 0 0 0 0  Fall Risk Category Calculator 0 0 0 0  Patient at Risk for Falls Due to No Fall Risks;Other (Comment) No Fall Risks No Fall Risks No Fall Risks  Fall risk Follow up Falls evaluation completed Falls evaluation completed Falls evaluation completed Falls evaluation completed   Functional Status Survey:    Vitals:   03/10/24 1356  BP: 102/60  Pulse: 87  Resp: 17  Temp: 98 F (36.7 C)  SpO2: 95%  Weight: 107 lb (48.5 kg)  Height: 5' 2 (1.575 m)   Body mass index is 19.57 kg/m. Physical  Exam  VITALS: T- 98.0, P- 87, BP- 102/, SaO2- 95% MEASUREMENTS: Weight- 107. GENERAL: Alert, cooperative, well developed, no acute distress HEENT: Normocephalic, normal oropharynx, moist mucous membranes CHEST: Clear to auscultation bilaterally, no wheezes, rhonchi, or crackles CARDIOVASCULAR: Normal heart rate and rhythm, S1 and S2 normal without murmurs ABDOMEN: Soft, non-tender, non-distended, without organomegaly, normal bowel sounds EXTREMITIES: No cyanosis or edema, extremities normal NEUROLOGICAL: Cranial nerves grossly intact, moves all extremities without gross motor or sensory deficit SKIN: Heels with sores, likely from rubbing in bed      Labs reviewed: Recent Labs    06/11/23 1500 02/27/24 1536 03/10/24 1515  NA 143 140 140  K 4.1 4.2 4.4  CL 102 100 102  CO2 30 28 28   GLUCOSE 88 109* 86  BUN 19 31* 18  CREATININE 1.09* 0.77 0.75  CALCIUM 9.7 9.1 9.3   No results for input(s): AST, ALT, ALKPHOS, BILITOT, PROT, ALBUMIN in the last 8760 hours. Recent Labs    06/11/23 1500 03/10/24 1515  WBC 5.5 6.4  NEUTROABS 3,262 4,320  HGB 14.2 13.0  HCT 42.9 40.2  MCV 96.2 95.3  PLT 271 346   No results found for: TSH Lab Results  Component Value Date   HGBA1C 5.4 08/25/2022   Lab Results  Component Value Date   CHOL 182 02/22/2023   HDL 49 (L) 02/22/2023   LDLCALC 104 (H) 02/22/2023   TRIG 174 (H) 02/22/2023   CHOLHDL 3.7 02/22/2023    Significant Diagnostic Results in last 30 days:  No results found.  Assessment/Plan Assessment and Plan    Dysphagia with risk of aspiration Chronic dysphagia with risk of aspiration. Swallowing difficulties persist despite previous speech therapy and swallowing tests. Coughing occurs with both pureed foods and liquids, even with thickened fluids. Requires assistance to swallow effectively.  Protein-calorie malnutrition with weight loss Significant weight loss from 116 lbs to 107 lbs. Consumes pureed foods  and high-protein Boost supplements. Protein levels are adequate, but overall intake may be insufficient due to swallowing difficulties. - Encourage small, frequent meals with high-protein content - Continue high-protein Boost supplements  Urinary tract infection, E. coli, follow-up after antibiotics Follow-up for UTI after completing antibiotics. Initial urine culture showed E. coli greater than 100,000 colonies. Previous urine analysis showed leukocytes but negative nitrites. No current symptoms of frequent urination or strong odor. - Collect urine sample for repeat culture to ensure resolution of infection  Hyponatremia and hypokalemia, monitoring after hospitalization Hyponatremia and hypokalemia noted during  hospitalization. Potassium supplementation was given. BUN elevated at 39, indicating possible dehydration. Creatinine levels are normal. Hydration status is suboptimal. - Recheck BMP to monitor sodium and potassium levels - Encourage increased fluid intake to improve hydration  Pressure ulcers of bilateral heels Pressure ulcers on bilateral heels, likely due to friction from bed. Current treatment with Neosporin and Band-Aids is insufficient as ulcers heal and recur. - Provide Mephilex foam dressing for heel protection - Consider using supportive boots to prevent pressure while in bed - Elevate legs in hospital bed to prevent sliding and reduce pressure   Family/ staff Communication: Reviewed plan of care with patient verbalized understanding   Labs/tests ordered:  - CBC/diff - BMP - Urine Culture; Future - POC Urinalysis Dipstick   Next Appointment: Return if symptoms worsen or fail to improve.   Total time: 20 minutes. Greater than 50% of total time spent doing patient education regarding Dysphagia,weight loss,UTI,Hypokalemia,Hypokalemia,health maintenance including symptom/medication management.   Roxan JAYSON Plough, NP

## 2024-04-22 ENCOUNTER — Other Ambulatory Visit: Payer: Self-pay

## 2024-04-22 DIAGNOSIS — Z1231 Encounter for screening mammogram for malignant neoplasm of breast: Secondary | ICD-10-CM

## 2024-05-22 ENCOUNTER — Telehealth: Payer: Self-pay

## 2024-05-22 NOTE — Transitions of Care (Post Inpatient/ED Visit) (Signed)
° °  05/22/2024  Name: Krystal Stevens MRN: [id number] DOB: 1955/06/27  Today's TOC FU Call Status: Today's TOC FU Call Status:: Unsuccessful Call (2nd Attempt) Unsuccessful Call (2nd Attempt) Date: 05/22/24  Attempted to reach the patient regarding the most recent Inpatient/ED visit.  Follow Up Plan: Additional outreach attempts will be made to reach the patient to complete the Transitions of Care (Post Inpatient/ED visit) call.   Shona Prow RN, CCM [place]  [facility]-Population Health RN Care Manager [phone]

## 2024-05-22 NOTE — Transitions of Care (Post Inpatient/ED Visit) (Signed)
° °  05/22/2024  Name: Krystal Stevens MRN: [id number] DOB: [birth year]  Today's TOC FU Call Status: Today's TOC FU Call Status:: Unsuccessful Call (1st Attempt) Unsuccessful Call (1st Attempt) Date: 05/22/24  Attempted to reach the patient regarding the most recent Inpatient/ED visit.  Follow Up Plan: Additional outreach attempts will be made to reach the patient to complete the Transitions of Care (Post Inpatient/ED visit) call.   Shona Prow RN, CCM [place]  [facility]BC[facility]-Population Health RN Care Manager [phone]

## 2024-05-23 ENCOUNTER — Telehealth: Payer: Self-pay

## 2024-05-23 NOTE — Transitions of Care (Post Inpatient/ED Visit) (Signed)
" ° °  05/23/2024  Name: Laurajean Hosek MRN: [id number] DOB: 02-20-1956  Today's TOC FU Call Status: Today's TOC FU Call Status:: Unsuccessful Call (3rd Attempt) Unsuccessful Call (3rd Attempt) Date: 05/23/24  Attempted to reach the patient regarding the most recent Inpatient/ED visit.  Follow Up Plan: No further outreach attempts will be made at this time. We have been unable to contact the patient.  Shona Prow RN, CCM [place]  [facility]BC[facility]-Population Health RN Care Manager [phone]  "

## 2024-06-20 NOTE — Progress Notes (Signed)
 Thank you for allowing us  to assist in the care of this patient.  Subjective:   Patient ID: Krystal Stevens is a 69 y.o. female.  HPI  69 year old ex-smoker who presents for evaluation of lung nodularity on recent CT chest.  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  Patient is accompanied in office today by her daughter who gives history.  Family history is positive for COPD and lung cancer.  Patient was recently hospitalized at [facility] due to altered mental status, dementia, pneumonia, and orthostatic hypotension.  CT chest completed on 05-17-2024 revealed scattered tree-in-bud lung nodular densities highly suggestive of pneumonitis from recurrent aspiration and mild emphysema of lung fields.  Patient has been evaluated by speech therapy, daughter has been educated on proper aspiration precautions.  ID was consulted during hospitalization felt that patient did not have active infection and that findings on CT chest were likely related to chronic recurrent aspiration pneumonitis, atypical infection such as MAI or fungal unable to be ruled out with recommendation that patient be referred to pulmonology for sputum culture or potential bronchoscopy.  Patient's daughter states that she has experienced worsening dementia episodes following anesthesia.  She has noted patient experience intermittent wheezing and coughing fits.  Patient takes no medication for COPD.  Daughter feels that she is able to collect sputum specimen.  There is no reported recent fever, chills, shortness of breath, chest pain, night sweats, or hemoptysis.  Review of Systems   A complete ROS was performed with pertinent positives/negatives noted in the HPI. The remainder of the ROS is negative.  Objective:   Physical Exam   Constitutional: He appears well-developed and well-nourished.  Head: Normocephalic and atraumatic.  Eyes: Conjunctivae are normal. Pupils  are equal, round, and reactive to light.  Neck: Normal range of motion.  Cardiovascular: Normal rate and regular rhythm. Exam reveals no gallop and no friction rub.  No murmur heard. Pulmonary/Chest: Effort normal and breath sounds diminished with shallow inspiration Abdominal: Soft.  Musculoskeletal: Normal range of motion.  Neurological: She responds to verbal commands.  Skin: Skin is warm and dry.  Psychiatric: Non-verbal, responds to commands by nodding.  Assessment/Plan:   1.  Lung nodules 2.  Recurrent aspiration pneumonitis 3.  Emphysema of lung  Records from patient's recent hospitalization were reviewed.  Her condition appears stable at this time.  Her daughter will try to submit sputum specimen for culture and AFB testing.  Order will be placed for patient received nebulizer for use with albuterol as needed.  Patient and daughter have been informed of potential side effects of medication.  Patient would like to proceed with repeat CT chest later this year.  She will follow-up in 2-3 months or visit sooner if needed.
